# Patient Record
Sex: Female | Born: 1980 | Hispanic: Yes | Marital: Married | Smoking: Never smoker
Health system: Southern US, Community
[De-identification: ages and names within clinical notes are randomized; demographics above are authoritative.]

## PROBLEM LIST (undated history)

## (undated) DIAGNOSIS — Z8489 Family history of other specified conditions: Secondary | ICD-10-CM

## (undated) DIAGNOSIS — N200 Calculus of kidney: Secondary | ICD-10-CM

## (undated) DIAGNOSIS — O09299 Supervision of pregnancy with other poor reproductive or obstetric history, unspecified trimester: Secondary | ICD-10-CM

## (undated) DIAGNOSIS — I517 Cardiomegaly: Secondary | ICD-10-CM

## (undated) DIAGNOSIS — D649 Anemia, unspecified: Secondary | ICD-10-CM

## (undated) DIAGNOSIS — R0600 Dyspnea, unspecified: Secondary | ICD-10-CM

## (undated) DIAGNOSIS — I272 Pulmonary hypertension, unspecified: Secondary | ICD-10-CM

## (undated) HISTORY — DX: Calculus of kidney: N20.0

## (undated) HISTORY — DX: Pulmonary hypertension, unspecified: I27.20

## (undated) HISTORY — PX: HERNIA REPAIR: SHX51

## (undated) HISTORY — PX: NO PAST SURGERIES: SHX2092

## (undated) HISTORY — DX: Supervision of pregnancy with other poor reproductive or obstetric history, unspecified trimester: O09.299

## (undated) HISTORY — DX: Cardiomegaly: I51.7

## (undated) HISTORY — DX: Family history of other specified conditions: Z84.89

## (undated) HISTORY — DX: Dyspnea, unspecified: R06.00

## (undated) HISTORY — DX: Anemia, unspecified: D64.9

---

## 2001-02-15 ENCOUNTER — Emergency Department (HOSPITAL_COMMUNITY): Admission: EM | Admit: 2001-02-15 | Discharge: 2001-02-15 | Payer: Self-pay | Admitting: *Deleted

## 2009-02-26 DIAGNOSIS — O1413 Severe pre-eclampsia, third trimester: Secondary | ICD-10-CM

## 2010-08-29 ENCOUNTER — Inpatient Hospital Stay (HOSPITAL_COMMUNITY): Payer: Medicaid Other

## 2010-08-29 ENCOUNTER — Inpatient Hospital Stay (HOSPITAL_COMMUNITY)
Admission: AD | Admit: 2010-08-29 | Discharge: 2010-08-29 | Disposition: A | Discharge status: Home / Self Care | Payer: Medicaid Other | Source: Ambulatory Visit | Attending: Obstetrics & Gynecology | Admitting: Obstetrics & Gynecology

## 2010-08-29 DIAGNOSIS — O9989 Other specified diseases and conditions complicating pregnancy, childbirth and the puerperium: Secondary | ICD-10-CM

## 2010-08-29 DIAGNOSIS — O99891 Other specified diseases and conditions complicating pregnancy: Secondary | ICD-10-CM | POA: Insufficient documentation

## 2010-08-29 DIAGNOSIS — R109 Unspecified abdominal pain: Secondary | ICD-10-CM | POA: Insufficient documentation

## 2010-08-29 LAB — WET PREP, GENITAL
Trich, Wet Prep: NONE SEEN
Yeast Wet Prep HPF POC: NONE SEEN

## 2010-08-29 LAB — URINALYSIS, ROUTINE W REFLEX MICROSCOPIC
Nitrite: NEGATIVE
Specific Gravity, Urine: 1.025 (ref 1.005–1.030)
Urobilinogen, UA: 0.2 mg/dL (ref 0.0–1.0)

## 2010-08-30 LAB — GC/CHLAMYDIA PROBE AMP, GENITAL: Chlamydia, DNA Probe: NEGATIVE

## 2010-08-31 LAB — URINE CULTURE: Culture  Setup Time: 201202210151

## 2011-01-05 ENCOUNTER — Inpatient Hospital Stay (HOSPITAL_COMMUNITY)
Admission: AD | Admit: 2011-01-05 | Discharge: 2011-01-07 | DRG: 775 | Disposition: A | Discharge status: Home / Self Care | Payer: Medicaid Other | Source: Ambulatory Visit | Attending: Family Medicine | Admitting: Family Medicine

## 2011-01-05 LAB — CBC
MCH: 23.2 pg — ABNORMAL LOW (ref 26.0–34.0)
MCV: 74.7 fL — ABNORMAL LOW (ref 78.0–100.0)
Platelets: 156 10*3/uL (ref 150–400)
RDW: 16 % — ABNORMAL HIGH (ref 11.5–15.5)
WBC: 7.5 10*3/uL (ref 4.0–10.5)

## 2011-01-05 LAB — RPR: RPR Ser Ql: NONREACTIVE

## 2011-01-24 ENCOUNTER — Other Ambulatory Visit: Payer: Self-pay | Admitting: Internal Medicine

## 2011-01-24 ENCOUNTER — Ambulatory Visit (HOSPITAL_COMMUNITY)
Admission: RE | Admit: 2011-01-24 | Discharge: 2011-01-24 | Disposition: A | Discharge status: Home / Self Care | Payer: Self-pay | Source: Ambulatory Visit | Attending: Internal Medicine | Admitting: Internal Medicine

## 2011-01-24 DIAGNOSIS — I2699 Other pulmonary embolism without acute cor pulmonale: Secondary | ICD-10-CM

## 2011-01-24 DIAGNOSIS — R0602 Shortness of breath: Secondary | ICD-10-CM | POA: Insufficient documentation

## 2011-01-24 DIAGNOSIS — R079 Chest pain, unspecified: Secondary | ICD-10-CM | POA: Insufficient documentation

## 2011-01-24 MED ORDER — IOHEXOL 300 MG/ML  SOLN
100.0000 mL | Freq: Once | INTRAMUSCULAR | Status: AC | PRN
  Administered 2011-01-24: 100 mL via INTRAVENOUS

## 2011-02-08 ENCOUNTER — Inpatient Hospital Stay (HOSPITAL_COMMUNITY)
Admission: AD | Admit: 2011-02-08 | Payer: Medicaid Other | Source: Ambulatory Visit | Admitting: Obstetrics & Gynecology

## 2011-07-06 ENCOUNTER — Encounter: Payer: Self-pay | Admitting: Cardiology

## 2011-07-07 ENCOUNTER — Institutional Professional Consult (permissible substitution): Payer: Medicaid Other | Admitting: Cardiology

## 2011-07-18 ENCOUNTER — Encounter: Payer: Self-pay | Admitting: Cardiology

## 2013-04-30 ENCOUNTER — Ambulatory Visit: Payer: Medicaid Other

## 2013-05-14 ENCOUNTER — Ambulatory Visit: Payer: Medicaid Other | Attending: Internal Medicine

## 2013-07-01 ENCOUNTER — Other Ambulatory Visit (HOSPITAL_COMMUNITY)
Admission: RE | Admit: 2013-07-01 | Discharge: 2013-07-01 | Disposition: A | Discharge status: Home / Self Care | Payer: No Typology Code available for payment source | Source: Ambulatory Visit | Attending: Internal Medicine | Admitting: Internal Medicine

## 2013-07-01 ENCOUNTER — Encounter: Payer: Self-pay | Admitting: Internal Medicine

## 2013-07-01 ENCOUNTER — Ambulatory Visit: Payer: No Typology Code available for payment source | Attending: Internal Medicine | Admitting: Internal Medicine

## 2013-07-01 VITALS — BP 107/72 | HR 60 | Temp 97.9°F | Resp 16 | Ht 59.0 in | Wt 162.0 lb

## 2013-07-01 DIAGNOSIS — Z113 Encounter for screening for infections with a predominantly sexual mode of transmission: Secondary | ICD-10-CM | POA: Insufficient documentation

## 2013-07-01 DIAGNOSIS — K029 Dental caries, unspecified: Secondary | ICD-10-CM | POA: Insufficient documentation

## 2013-07-01 DIAGNOSIS — K429 Umbilical hernia without obstruction or gangrene: Secondary | ICD-10-CM | POA: Insufficient documentation

## 2013-07-01 DIAGNOSIS — Z1151 Encounter for screening for human papillomavirus (HPV): Secondary | ICD-10-CM | POA: Insufficient documentation

## 2013-07-01 DIAGNOSIS — Z01419 Encounter for gynecological examination (general) (routine) without abnormal findings: Secondary | ICD-10-CM | POA: Insufficient documentation

## 2013-07-01 DIAGNOSIS — N911 Secondary amenorrhea: Secondary | ICD-10-CM | POA: Insufficient documentation

## 2013-07-01 DIAGNOSIS — N760 Acute vaginitis: Secondary | ICD-10-CM | POA: Insufficient documentation

## 2013-07-01 DIAGNOSIS — Z0000 Encounter for general adult medical examination without abnormal findings: Secondary | ICD-10-CM | POA: Insufficient documentation

## 2013-07-01 DIAGNOSIS — N912 Amenorrhea, unspecified: Secondary | ICD-10-CM

## 2013-07-01 LAB — CBC WITH DIFFERENTIAL/PLATELET
Basophils Absolute: 0 10*3/uL (ref 0.0–0.1)
Eosinophils Absolute: 0.3 10*3/uL (ref 0.0–0.7)
Eosinophils Relative: 3 % (ref 0–5)
MCH: 26.8 pg (ref 26.0–34.0)
MCV: 79.6 fL (ref 78.0–100.0)
Platelets: 281 10*3/uL (ref 150–400)
RDW: 14.3 % (ref 11.5–15.5)
WBC: 8.6 10*3/uL (ref 4.0–10.5)

## 2013-07-01 LAB — LIPID PANEL
HDL: 50 mg/dL (ref >39)
LDL Cholesterol: 155 mg/dL — ABNORMAL HIGH (ref 0–99)

## 2013-07-01 LAB — CMP AND LIVER
ALT: 24 U/L (ref 0–35)
AST: 22 U/L (ref 0–37)
Alkaline Phosphatase: 73 U/L (ref 39–117)
Bilirubin, Direct: 0.1 mg/dL (ref 0.0–0.3)
Creat: 0.5 mg/dL (ref 0.50–1.10)
Total Bilirubin: 0.3 mg/dL (ref 0.3–1.2)

## 2013-07-01 LAB — TSH: TSH: 0.68 u[IU]/mL (ref 0.350–4.500)

## 2013-07-01 NOTE — Progress Notes (Signed)
Pt is here to establish care. Pt is requesting a physical and to check where she has a hernia.

## 2013-07-02 LAB — URINALYSIS, COMPLETE
Bacteria, UA: NONE SEEN
Bilirubin Urine: NEGATIVE
Casts: NONE SEEN
Crystals: NONE SEEN
Glucose, UA: NEGATIVE mg/dL
Ketones, ur: NEGATIVE mg/dL
Leukocytes, UA: NEGATIVE
Nitrite: NEGATIVE
Protein, ur: NEGATIVE mg/dL
Specific Gravity, Urine: 1.024 (ref 1.005–1.030)
Urobilinogen, UA: 0.2 mg/dL (ref 0.0–1.0)
pH: 5.5 (ref 5.0–8.0)

## 2013-07-02 NOTE — Progress Notes (Signed)
Patient ID: Kelly Oneal, female   DOB: 1980-07-29, 32 y.o.   MRN: [id number] Patient Demographics  Kelly Oneal, is a 32 y.o. female  NWG:[id number]  [id number]:[id number]  DOB - [birth year]  CC:  Chief Complaint  Patient presents with  . Establish Care       HPI: Kelly Oneal is a 32 y.o. female here today to establish medical care. Patient has no significant past medical history. She wants a general physical examination and a Pap smear done. Her last child birth was about 2 and half years ago, since then she has not seen her menstrual period. She told me she's not concerned, she breast-fed for a little while or one year, not present in in the morning. She denies any headache or blurry vision. No bloating or abdominal distention. During her last pregnancy she noticed that her umbilical hernia increased in size and she experiences abdominal discomfort occasionally as a result. She wants to be referred for repair. She does not smoke cigarette, she does not drink alcohol Patient has No headache, No chest pain, No abdominal pain - No Nausea, No new weakness tingling or numbness, No Cough - SOB.  Allergies  Allergen Reactions  . Penicillins    History reviewed. No pertinent past medical history. No current outpatient prescriptions on file prior to visit.   No current facility-administered medications on file prior to visit.   History reviewed. No pertinent family history. History   Social History  . Marital Status: Married    Spouse Name: N/A    Number of Children: N/A  . Years of Education: N/A   Occupational History  . Not on file.   Social History Main Topics  . Smoking status: Never Smoker   . Smokeless tobacco: Not on file  . Alcohol Use: No  . Drug Use: No  . Sexual Activity: Yes   Other Topics Concern  . Not on file   Social History Narrative  . No narrative on file    Review of Systems: Constitutional: Negative for fever, chills,  diaphoresis, activity change, appetite change and fatigue. HENT: Negative for ear pain, nosebleeds, congestion, facial swelling, rhinorrhea, neck pain, neck stiffness and ear discharge.  Eyes: Negative for pain, discharge, redness, itching and visual disturbance. Respiratory: Negative for cough, choking, chest tightness, shortness of breath, wheezing and stridor.  Cardiovascular: Negative for chest pain, palpitations and leg swelling. Gastrointestinal: Umbilicus hernia with the patient her discomfort, Negative for abdominal distention. Genitourinary: Negative for dysuria, urgency, frequency, hematuria, flank pain, decreased urine volume, difficulty urinating and dyspareunia.  Musculoskeletal: Negative for back pain, joint swelling, arthralgia and gait problem. Neurological: Negative for dizziness, tremors, seizures, syncope, facial asymmetry, speech difficulty, weakness, light-headedness, numbness and headaches.  Hematological: Negative for adenopathy. Does not bruise/bleed easily. Psychiatric/Behavioral: Negative for hallucinations, behavioral problems, confusion, dysphoric mood, decreased concentration and agitation.    Objective:   Filed Vitals:   07/01/13 1025  BP: 107/72  Pulse: 60  Temp: 97.9 F (36.6 C)  Resp: 16    Physical Exam: Constitutional: Patient appears well-developed and well-nourished. No distress, obese HENT: Normocephalic, atraumatic, External right and left ear normal. Oropharynx is clear and moist. Dental caries Eyes: Conjunctivae and EOM are normal. PERRLA, no scleral icterus. Neck: Normal ROM. Neck supple. No JVD. No tracheal deviation. No thyromegaly. CVS: RRR, S1/S2 +, no murmurs, no gallops, no carotid bruit.  Pulmonary: Effort and breath sounds normal, no stridor, rhonchi, wheezes, rales.  Abdominal: Soft. BS +, no distension, umbilicus hernia, reducible, nontender,  no tenderness, rebound or guarding.  Musculoskeletal: Normal range of motion. No edema and  no tenderness.  Lymphadenopathy: No lymphadenopathy noted, cervical, inguinal or axillary Neuro: Alert. Normal reflexes, muscle tone coordination. No cranial nerve deficit. Skin: Skin is warm and dry. No rash noted. Not diaphoretic. No erythema. No pallor. Psychiatric: Normal mood and affect. Behavior, judgment, thought content normal.  Lab Results  Component Value Date   WBC 8.6 07/01/2013   HGB 14.3 07/01/2013   HCT 42.5 07/01/2013   MCV 79.6 07/01/2013   PLT 281 07/01/2013   Lab Results  Component Value Date   CREATININE 0.50 07/01/2013   BUN 11 07/01/2013   NA 137 07/01/2013   K 3.5 07/01/2013   CL 101 07/01/2013   CO2 28 07/01/2013    No results found for this basename: HGBA1C   Lipid Panel     Component Value Date/Time   CHOL 225* 07/01/2013 1127   TRIG 99 07/01/2013 1127   HDL 50 07/01/2013 1127   CHOLHDL 4.5 07/01/2013 1127   VLDL 20 07/01/2013 1127   LDLCALC 155* 07/01/2013 1127       Assessment and plan:   1. Amenorrhea, secondary  - TSH - T4, free May need hormonal workup and possible gynecology referral, patient is not concerned at the moment and does not want to do these tests  2. Annual physical exam  - CBC with Differential - CMP and Liver - Urinalysis, Complete - Lipid panel - TSH - T4, free - Cytology - PAP - Cervicovaginal ancillary only  3. Umbilical hernia  - Ambulatory referral to General Surgery  4. Dental caries  - Ambulatory referral to Dentistry  Patient was extensively counseled on nutrition and exercise  Follow up in 3 months or when necessary   The patient was given clear instructions to go to ER or return to medical center if symptoms don't improve, worsen or new problems develop. The patient verbalized understanding. The patient was told to call to get lab results if they haven't heard anything in the next week.     Jeanann Lewandowsky, MD, MHA, FACP, FAAP [facility]  [place], [place] [phone]   07/02/2013, 2:13 PM

## 2013-09-29 ENCOUNTER — Ambulatory Visit: Payer: No Typology Code available for payment source

## 2016-07-12 ENCOUNTER — Encounter: Payer: Self-pay | Admitting: Internal Medicine

## 2016-08-30 ENCOUNTER — Encounter (HOSPITAL_BASED_OUTPATIENT_CLINIC_OR_DEPARTMENT_OTHER): Payer: Self-pay | Admitting: *Deleted

## 2016-08-30 ENCOUNTER — Emergency Department (HOSPITAL_BASED_OUTPATIENT_CLINIC_OR_DEPARTMENT_OTHER)
Admission: EM | Admit: 2016-08-30 | Discharge: 2016-08-31 | Disposition: A | Discharge status: Home / Self Care | Payer: Self-pay | Attending: Emergency Medicine | Admitting: Emergency Medicine

## 2016-08-30 DIAGNOSIS — R112 Nausea with vomiting, unspecified: Secondary | ICD-10-CM | POA: Insufficient documentation

## 2016-08-30 DIAGNOSIS — J029 Acute pharyngitis, unspecified: Secondary | ICD-10-CM | POA: Insufficient documentation

## 2016-08-30 NOTE — ED Triage Notes (Signed)
Pt c/o sore throat and vomiting x 3 days

## 2016-08-31 ENCOUNTER — Emergency Department (HOSPITAL_BASED_OUTPATIENT_CLINIC_OR_DEPARTMENT_OTHER): Payer: Self-pay

## 2016-08-31 LAB — RAPID STREP SCREEN (MED CTR MEBANE ONLY): STREPTOCOCCUS, GROUP A SCREEN (DIRECT): NEGATIVE

## 2016-08-31 LAB — PREGNANCY, URINE: Preg Test, Ur: NEGATIVE

## 2016-08-31 MED ORDER — SODIUM CHLORIDE 0.9 % IV BOLUS (SEPSIS)
1000.0000 mL | Freq: Once | INTRAVENOUS | Status: AC
  Administered 2016-08-31: 1000 mL via INTRAVENOUS

## 2016-08-31 MED ORDER — ONDANSETRON 8 MG PO TBDP: 8.0000 mg | ORAL_TABLET | Freq: Three times a day (TID) | ORAL | 0 refills | Status: DC | PRN

## 2016-08-31 MED ORDER — ONDANSETRON HCL 4 MG/2ML IJ SOLN
4.0000 mg | Freq: Once | INTRAMUSCULAR | Status: AC
  Administered 2016-08-31: 4 mg via INTRAVENOUS
  Filled 2016-08-31: qty 2

## 2016-08-31 MED ORDER — DEXAMETHASONE SODIUM PHOSPHATE 10 MG/ML IJ SOLN
10.0000 mg | Freq: Once | INTRAMUSCULAR | Status: AC
  Administered 2016-08-31: 10 mg via INTRAVENOUS
  Filled 2016-08-31: qty 1

## 2016-08-31 MED ORDER — LORAZEPAM 2 MG/ML IJ SOLN
1.0000 mg | Freq: Once | INTRAMUSCULAR | Status: AC
  Administered 2016-08-31: 1 mg via INTRAVENOUS
  Filled 2016-08-31: qty 1

## 2016-08-31 NOTE — ED Provider Notes (Signed)
[facility] Provider Note: Lowella Dell, MD, FACEP  CSN: [id number] MRN: [id number] ARRIVAL: 08/30/16 at 2021 ROOM: MH01/MH01   CHIEF COMPLAINT  Vomiting   HISTORY OF PRESENT ILLNESS  Kelly Oneal is a 36 y.o. female with a two-day history of nausea, vomiting and sore throat. She has not been able to keep anything on her stomach. She has moderate pain in her throat, worse with swallowing and is having difficulty swallowing her saliva. She is not having difficulty breathing. She denies abdominal pain, diarrhea or fever.   Past Medical History:  Diagnosis Date  . Anemia   . Cardiomegaly   . Dyspnea   . Kidney stones   . Pulmonary hypertension     Past Surgical History:  Procedure Laterality Date  . HERNIA REPAIR      No family history on file.  Social History  Substance Use Topics  . Smoking status: Never Smoker  . Smokeless tobacco: Not on file  . Alcohol use No    Prior to Admission medications   Medication Sig Start Date End Date Taking? Authorizing Provider  ondansetron (ZOFRAN ODT) 8 MG disintegrating tablet Take 1 tablet (8 mg total) by mouth every 8 (eight) hours as needed for nausea or vomiting. 08/31/16   Paula Libra, MD    Allergies Penicillins   REVIEW OF SYSTEMS  Negative except as noted here or in the History of Present Illness.   PHYSICAL EXAMINATION  Initial Vital Signs Blood pressure 120/95, pulse 89, temperature 98.6 F (37 C), temperature source Oral, resp. rate 18, height 4\' 11"  (1.499 m), weight 140 lb (63.5 kg), last menstrual period 08/16/2016, SpO2 99 %.  Examination General: Well-developed, well-nourished female in no acute distress; appearance consistent with age of record HENT: normocephalic; atraumatic; pharyngeal erythema without exudate; no dysphonia; uvula midline Eyes: pupils equal, round and reactive to light; extraocular muscles intact Neck: supple; anterior cervical lymphadenopathy Heart: regular rate  and rhythm Lungs: clear to auscultation bilaterally Abdomen: soft; nondistended; nontender; no masses or hepatosplenomegaly; bowel sounds present Extremities: No deformity; full range of motion; pulses normal Neurologic: Awake, alert and oriented; motor function intact in all extremities and symmetric; no facial droop Skin: Warm and dry Psychiatric: Normal mood and affect   RESULTS  Summary of this visit's results, reviewed by myself:   EKG Interpretation  Date/Time:    Ventricular Rate:    PR Interval:    QRS Duration:   QT Interval:    QTC Calculation:   R Axis:     Text Interpretation:        Laboratory Studies: Results for orders placed or performed during the hospital encounter of 08/30/16 (from the past 24 hour(s))  Rapid strep screen     Status: None   Collection Time: 08/31/16 12:20 AM  Result Value Ref Range   Streptococcus, Group A Screen (Direct) NEGATIVE NEGATIVE  Pregnancy, urine     Status: None   Collection Time: 08/31/16 12:55 AM  Result Value Ref Range   Preg Test, Ur NEGATIVE NEGATIVE   Imaging Studies: Dg Neck Soft Tissue  Result Date: 08/31/2016 CLINICAL DATA:  Sore throat and vomiting EXAM: NECK SOFT TISSUES - 1+ VIEW COMPARISON:  None. FINDINGS: Prevertebral soft tissue thickness is normal. Epiglottis normal. Subglottic trachea patent. Straightening of the cervical spine. IMPRESSION: Negative. Electronically Signed   By: Jasmine Pang M.D.   On: 08/31/2016 02:10    ED COURSE  Nursing notes and initial vitals signs, including pulse oximetry, reviewed.  Vitals:   08/30/16 2046 08/31/16 0016 08/31/16 0021 08/31/16 0310  BP: 120/95  117/92 99/68  Pulse: 89  98 88  Resp: 18  18 16   Temp: 98.6 F (37 C) 98.7 F (37.1 C)  98.3 F (36.8 C)  TempSrc: Oral Oral  Oral  SpO2: 99%  98% 98%  Weight: 140 lb (63.5 kg)     Height: 4\' 11"  (1.499 m)      3:13 AM Patient now feeling better after multiple medications. She is drinking fluids without emesis.  Her throat pain has improved.  PROCEDURES    ED DIAGNOSES     ICD-9-CM ICD-10-CM   1. Nausea and vomiting in adult 787.01 R11.2   2. Sore throat 462 J02.9        Paula LibraJohn Yarnell Kozloski, MD 08/31/16 [phone]

## 2016-08-31 NOTE — ED Notes (Signed)
Pt was able to tolerate ice chips and then started vomiting after trying to drink sprite

## 2016-08-31 NOTE — ED Notes (Signed)
Pt verbalizes understanding of d/c instructions and denies any further needs at this time. 

## 2016-09-02 LAB — CULTURE, GROUP A STREP (THRC)

## 2019-09-02 ENCOUNTER — Emergency Department (HOSPITAL_COMMUNITY): Payer: Self-pay

## 2019-09-02 ENCOUNTER — Other Ambulatory Visit: Payer: Self-pay

## 2019-09-02 ENCOUNTER — Encounter (HOSPITAL_COMMUNITY): Payer: Self-pay | Admitting: *Deleted

## 2019-09-02 ENCOUNTER — Emergency Department (HOSPITAL_COMMUNITY)
Admission: EM | Admit: 2019-09-02 | Discharge: 2019-09-02 | Disposition: A | Discharge status: Home / Self Care | Payer: Self-pay | Attending: Emergency Medicine | Admitting: Emergency Medicine

## 2019-09-02 DIAGNOSIS — W010XXA Fall on same level from slipping, tripping and stumbling without subsequent striking against object, initial encounter: Secondary | ICD-10-CM | POA: Insufficient documentation

## 2019-09-02 DIAGNOSIS — R52 Pain, unspecified: Secondary | ICD-10-CM

## 2019-09-02 DIAGNOSIS — Y939 Activity, unspecified: Secondary | ICD-10-CM | POA: Insufficient documentation

## 2019-09-02 DIAGNOSIS — R0789 Other chest pain: Secondary | ICD-10-CM | POA: Insufficient documentation

## 2019-09-02 DIAGNOSIS — Y999 Unspecified external cause status: Secondary | ICD-10-CM | POA: Insufficient documentation

## 2019-09-02 DIAGNOSIS — Y929 Unspecified place or not applicable: Secondary | ICD-10-CM | POA: Insufficient documentation

## 2019-09-02 MED ORDER — ACETAMINOPHEN 500 MG PO TABS
1000.0000 mg | ORAL_TABLET | Freq: Once | ORAL | Status: AC
  Administered 2019-09-02: 1000 mg via ORAL
  Filled 2019-09-02: qty 2

## 2019-09-02 MED ORDER — OXYCODONE HCL 5 MG PO TABS
5.0000 mg | ORAL_TABLET | Freq: Once | ORAL | Status: AC
Start: 2019-09-02 — End: 2019-09-02
  Administered 2019-09-02: 5 mg via ORAL
  Filled 2019-09-02: qty 1

## 2019-09-02 MED ORDER — KETOROLAC TROMETHAMINE 15 MG/ML IJ SOLN
15.0000 mg | Freq: Once | INTRAMUSCULAR | Status: AC
  Administered 2019-09-02: 12:00:00 15 mg via INTRAMUSCULAR
  Filled 2019-09-02: qty 1

## 2019-09-02 NOTE — ED Notes (Signed)
Provided patient an incentive spirometer and patient was able to demonstrate use after instructions.

## 2019-09-02 NOTE — Discharge Instructions (Signed)
You do not have an obvious fracture on your x-ray.  This does not mean that you do not have a broken rib.  Please take Tylenol and ibuprofen as needed for pain.  Try to splint this with a pillow as you need to take a deep breath.  Use the incentive spirometer 10 minutes out of every hour you are awake for the next week.  Please return for worsening shortness of breath pain or fever.

## 2019-09-02 NOTE — ED Provider Notes (Signed)
[facility]-EMERGENCY DEPT Provider Note   CSN: [id number] Arrival date & time: 09/02/19  1106     History Chief Complaint  Patient presents with  . Fall    Kelly Oneal is a 39 y.o. female.  39 yo F with a chief complaint of left chest wall pain.  Patient slipped this morning and landed on her left side.  Having some severe pain.  Start about 30 minutes ago.  She denies other injury.  Denies head injury denies loss consciousness denies neck pain denies back pain denies extremity pain.  Denies abdominal pain.  Some pain worse with breathing twisting or turning.  Described as sharp.  The history is provided by the patient.  Fall This is a new problem. The current episode started less than 1 hour ago. The problem occurs constantly. The problem has not changed since onset.Associated symptoms include chest pain. Pertinent negatives include no abdominal pain, no headaches and no shortness of breath. The symptoms are aggravated by bending, twisting and walking. Nothing relieves the symptoms. She has tried nothing for the symptoms. The treatment provided no relief.       Past Medical History:  Diagnosis Date  . Anemia   . Cardiomegaly   . Dyspnea   . Kidney stones   . Pulmonary hypertension [facility])     Patient Active Problem List   Diagnosis Date Noted  . Amenorrhea, secondary 07/01/2013  . Annual physical exam 07/01/2013  . Umbilical hernia 07/01/2013  . Dental caries 07/01/2013    Past Surgical History:  Procedure Laterality Date  . HERNIA REPAIR       OB History   No obstetric history on file.     No family history on file.  Social History   Tobacco Use  . Smoking status: Never Smoker  . Smokeless tobacco: Never Used  Substance Use Topics  . Alcohol use: No  . Drug use: No    Home Medications Prior to Admission medications   Medication Sig Start Date End Date Taking? Authorizing Provider  ondansetron (ZOFRAN ODT) 8 MG  disintegrating tablet Take 1 tablet (8 mg total) by mouth every 8 (eight) hours as needed for nausea or vomiting. 08/31/16   Molpus, John, MD    Allergies    Penicillins  Review of Systems   Review of Systems  Constitutional: Negative for chills and fever.  HENT: Negative for congestion and rhinorrhea.   Eyes: Negative for redness and visual disturbance.  Respiratory: Negative for shortness of breath and wheezing.   Cardiovascular: Positive for chest pain. Negative for palpitations.  Gastrointestinal: Negative for abdominal pain, nausea and vomiting.  Genitourinary: Negative for dysuria and urgency.  Musculoskeletal: Negative for arthralgias and myalgias.  Skin: Negative for pallor and wound.  Neurological: Negative for dizziness and headaches.    Physical Exam Updated Vital Signs BP 110/79 (BP Location: Left Arm)   Pulse 74   Temp 97.8 F (36.6 C) (Oral)   Resp 14   Ht 4\' 11"  (1.499 m)   Wt 68 kg   SpO2 97%   BMI 30.30 kg/m   Physical Exam Vitals and nursing note reviewed.  Constitutional:      General: She is not in acute distress.    Appearance: She is well-developed. She is not diaphoretic.  HENT:     Head: Normocephalic and atraumatic.  Eyes:     Pupils: Pupils are equal, round, and reactive to light.  Cardiovascular:     Rate and Rhythm: Normal  rate and regular rhythm.     Heart sounds: No murmur. No friction rub. No gallop.   Pulmonary:     Effort: Pulmonary effort is normal.     Breath sounds: No wheezing or rales.  Abdominal:     General: There is no distension.     Palpations: Abdomen is soft.     Tenderness: There is no abdominal tenderness.  Musculoskeletal:        General: Tenderness present.     Cervical back: Normal range of motion and neck supple.     Comments: Tenderness about the posterior rib angles about ribs 7 through 9 on the left.  No crepitus.  Clear lungs.  Palpated from head to toe without any other noted areas of bony tenderness.    Skin:    General: Skin is warm and dry.  Neurological:     Mental Status: She is alert and oriented to person, place, and time.  Psychiatric:        Behavior: Behavior normal.     ED Results / Procedures / Treatments   Labs (all labs ordered are listed, but only abnormal results are displayed) Labs Reviewed - No data to display  EKG None  Radiology DG Ribs Unilateral W/Chest Left  Result Date: 09/02/2019 CLINICAL DATA:  Fall, left lower rib pain EXAM: LEFT RIBS AND CHEST - 3+ VIEW COMPARISON:  None. FINDINGS: No fracture or other bone lesions are seen involving the ribs. There is no evidence of pneumothorax or pleural effusion. Both lungs are clear. Heart size and mediastinal contours are within normal limits. IMPRESSION: No displaced rib fracture or other radiographic abnormality to explain pain. Electronically Signed   By: Eddie Candle M.D.   On: 09/02/2019 11:41    Procedures Procedures (including critical care time)  Medications Ordered in ED Medications  acetaminophen (TYLENOL) tablet 1,000 mg (1,000 mg Oral Given 09/02/19 1148)  ketorolac (TORADOL) 15 MG/ML injection 15 mg (15 mg Intramuscular Given 09/02/19 1148)  oxyCODONE (Oxy IR/ROXICODONE) immediate release tablet 5 mg (5 mg Oral Given 09/02/19 1148)    ED Course  I have reviewed the triage vital signs and the nursing notes.  Pertinent labs & imaging results that were available during my care of the patient were reviewed by me and considered in my medical decision making (see chart for details).    MDM Rules/Calculators/A&P                      39 yo F with a chief complaint of a fall from standing and left-sided chest wall pain.  Plain film viewed by me without obvious fracture or pneumothorax.  Will treat with incentive spirometry Tylenol and ibuprofen.  Have her follow-up with her family doctor.  11:51 AM:  I have discussed the diagnosis/risks/treatment options with the patient and believe the pt to be  eligible for discharge home to follow-up with PCP. We also discussed returning to the ED immediately if new or worsening sx occur. We discussed the sx which are most concerning (e.g., sudden worsening pain, fever, inability to tolerate by mouth) that necessitate immediate return. Medications administered to the patient during their visit and any new prescriptions provided to the patient are listed below.  Medications given during this visit Medications  acetaminophen (TYLENOL) tablet 1,000 mg (1,000 mg Oral Given 09/02/19 1148)  ketorolac (TORADOL) 15 MG/ML injection 15 mg (15 mg Intramuscular Given 09/02/19 1148)  oxyCODONE (Oxy IR/ROXICODONE) immediate release tablet 5 mg (5 mg  Oral Given 09/02/19 1148)     The patient appears reasonably screen and/or stabilized for discharge and I doubt any other medical condition or other [facility] requiring further screening, evaluation, or treatment in the ED at this time prior to discharge.   Final Clinical Impression(s) / ED Diagnoses Final diagnoses:  Chest wall pain    Rx / DC Orders ED Discharge Orders    None       Melene Plan, DO 09/02/19 1151

## 2019-09-02 NOTE — ED Triage Notes (Signed)
Slipped and fell PTA hitting left ribs against box. No LOC or other body part involved.

## 2020-02-17 ENCOUNTER — Emergency Department (HOSPITAL_COMMUNITY): Payer: Self-pay

## 2020-02-17 ENCOUNTER — Other Ambulatory Visit: Payer: Self-pay

## 2020-02-17 ENCOUNTER — Emergency Department (HOSPITAL_COMMUNITY)
Admission: EM | Admit: 2020-02-17 | Discharge: 2020-02-17 | Disposition: A | Discharge status: Home / Self Care | Payer: Self-pay | Attending: Emergency Medicine | Admitting: Emergency Medicine

## 2020-02-17 ENCOUNTER — Encounter (HOSPITAL_COMMUNITY): Payer: Self-pay | Admitting: Student

## 2020-02-17 DIAGNOSIS — Y939 Activity, unspecified: Secondary | ICD-10-CM | POA: Insufficient documentation

## 2020-02-17 DIAGNOSIS — S93492A Sprain of other ligament of left ankle, initial encounter: Secondary | ICD-10-CM | POA: Insufficient documentation

## 2020-02-17 DIAGNOSIS — W541XXA Struck by dog, initial encounter: Secondary | ICD-10-CM | POA: Insufficient documentation

## 2020-02-17 DIAGNOSIS — Y929 Unspecified place or not applicable: Secondary | ICD-10-CM | POA: Insufficient documentation

## 2020-02-17 DIAGNOSIS — I272 Pulmonary hypertension, unspecified: Secondary | ICD-10-CM | POA: Insufficient documentation

## 2020-02-17 DIAGNOSIS — Y999 Unspecified external cause status: Secondary | ICD-10-CM | POA: Insufficient documentation

## 2020-02-17 NOTE — ED Triage Notes (Signed)
Patient BIB POV by her husband after a fall this morning.  Patient stated that she tripped over her dog and injured her left ankle.  Ankle is swollen and 7/10 pain.  Patient arrived to triage in a wheelchair and is A&OX4.

## 2020-02-17 NOTE — ED Provider Notes (Signed)
[facility]-EMERGENCY DEPT Provider Note   CSN: [id number] Arrival date & time: 02/17/20  0865     History Chief Complaint  Patient presents with  . Fall  . Ankle Injury    Kelly Oneal is a 39 y.o. female who presents emergency department chief complaint of left ankle pain.  Patient states that she injured her left ankle when she accidentally stepped on her dog.  She had an inversion injury.  She had immediate severe pain and has significant swelling to the left outer ankle.  She was unable to ambulate prior to arrival.  She denies any numbness or tingling.  HPI     Past Medical History:  Diagnosis Date  . Anemia   . Cardiomegaly   . Dyspnea   . Kidney stones   . Pulmonary hypertension [facility])     Patient Active Problem List   Diagnosis Date Noted  . Amenorrhea, secondary 07/01/2013  . Annual physical exam 07/01/2013  . Umbilical hernia 07/01/2013  . Dental caries 07/01/2013    Past Surgical History:  Procedure Laterality Date  . HERNIA REPAIR       OB History   No obstetric history on file.     History reviewed. No pertinent family history.  Social History   Tobacco Use  . Smoking status: Never Smoker  . Smokeless tobacco: Never Used  Substance Use Topics  . Alcohol use: No  . Drug use: No    Home Medications Prior to Admission medications   Medication Sig Start Date End Date Taking? Authorizing Provider  ondansetron (ZOFRAN ODT) 8 MG disintegrating tablet Take 1 tablet (8 mg total) by mouth every 8 (eight) hours as needed for nausea or vomiting. 08/31/16   Molpus, John, MD    Allergies    Penicillins  Review of Systems   Review of Systems Ten systems reviewed and are negative for acute change, except as noted in the HPI.   Physical Exam Updated Vital Signs BP 118/83   Pulse 76   Temp 98.6 F (37 C) (Oral)   Resp 16   Ht 4\' 11"  (1.499 m)   Wt 68 kg   SpO2 98%   BMI 30.30 kg/m   Physical  Exam Vitals and nursing note reviewed.  Constitutional:      General: She is not in acute distress.    Appearance: She is well-developed. She is not diaphoretic.  HENT:     Head: Normocephalic and atraumatic.  Eyes:     General: No scleral icterus.    Conjunctiva/sclera: Conjunctivae normal.  Cardiovascular:     Rate and Rhythm: Normal rate and regular rhythm.     Heart sounds: Normal heart sounds. No murmur heard.  No friction rub. No gallop.   Pulmonary:     Effort: Pulmonary effort is normal. No respiratory distress.     Breath sounds: Normal breath sounds.  Abdominal:     General: Bowel sounds are normal. There is no distension.     Palpations: Abdomen is soft. There is no mass.     Tenderness: There is no abdominal tenderness. There is no guarding.  Musculoskeletal:     Cervical back: Normal range of motion.     Comments: Patient appears well, vital signs are normal.  CV: RRR, No M/R/G, Peripheral pulses intact. No peripheral edema. Lungs: CTAB Abd: Soft, Non tender, non distended Musculoskeletal: There is swelling and tenderness over the lateral malleolus. No tenderness over the medial aspect of the ankle.  The fifth metatarsal is not tender. The ankle joint is intact without excessive opening on stressing. X-Ray shows fracture to be negative. The rest of the foot, ankle and leg exam is normal.   Skin:    General: Skin is warm and dry.  Neurological:     Mental Status: She is alert and oriented to person, place, and time.  Psychiatric:        Behavior: Behavior normal.     ED Results / Procedures / Treatments   Labs (all labs ordered are listed, but only abnormal results are displayed) Labs Reviewed - No data to display  EKG None  Radiology DG Ankle Complete Left  Result Date: 02/17/2020 CLINICAL DATA:  Larey Seat and injured left ankle is morning. EXAM: LEFT ANKLE COMPLETE - 3+ VIEW COMPARISON:  None. FINDINGS: The ankle mortise is maintained. No acute ankle fracture  is identified. The mid and hindfoot bony structures are intact. Extensive soft tissue swelling mainly laterally. IMPRESSION: No acute ankle fracture. Electronically Signed   By: Rudie Meyer M.D.   On: 02/17/2020 11:10    Procedures Procedures (including critical care time)  Medications Ordered in ED Medications - No data to display  ED Course  I have reviewed the triage vital signs and the nursing notes.  Pertinent labs & imaging results that were available during my care of the patient were reviewed by me and considered in my medical decision making (see chart for details).    MDM Rules/Calculators/A&P                          Patient with left ankle sprain.  I personally reviewed the patient's x-ray which shows no evidence of fracture dislocation.  Patient placed in ASO splint with crutches.  Discussed outpatient follow-up and return precautions.  Patient appears otherwise appropriate for discharge at this time. Final Clinical Impression(s) / ED Diagnoses Final diagnoses:  Sprain of anterior talofibular ligament of left ankle, initial encounter    Rx / DC Orders ED Discharge Orders    None       Arthor Captain, PA-C 02/17/20 1223    Geoffery Lyons, MD 02/18/20 [phone]

## 2020-02-17 NOTE — ED Notes (Signed)
Ortho tecg made aware patient needs crutches and an ASO for her left ankle.

## 2020-02-17 NOTE — Discharge Instructions (Signed)
Contact a health care provider if: °You have rapidly increasing bruising or swelling. °Your pain is not relieved with medicine. °Get help right away if: °Your foot or toes become numb or blue. °You have severe pain that gets worse. °

## 2021-10-25 ENCOUNTER — Ambulatory Visit
Admission: EM | Admit: 2021-10-25 | Discharge: 2021-10-25 | Disposition: A | Discharge status: Home / Self Care | Payer: 59 | Attending: Internal Medicine | Admitting: Internal Medicine

## 2021-10-25 ENCOUNTER — Encounter: Payer: Self-pay | Admitting: Emergency Medicine

## 2021-10-25 DIAGNOSIS — J029 Acute pharyngitis, unspecified: Secondary | ICD-10-CM | POA: Diagnosis present

## 2021-10-25 LAB — POCT RAPID STREP A (OFFICE): Rapid Strep A Screen: NEGATIVE

## 2021-10-25 NOTE — Discharge Instructions (Signed)
Rapid strep was negative.  Throat culture and COVID test are pending.  We will call if they are positive.  It appears that you have a viral illness that should run its course and self resolve in the next few days.  Please follow-up if symptoms persist or worsen. ?

## 2021-10-25 NOTE — ED Triage Notes (Signed)
Patient c/o fever x 1 day and sore throat, facial swelling.  No other sx's.  Denies taken any OTC pain meds. ?

## 2021-10-25 NOTE — ED Provider Notes (Signed)
?[facility] ? ? ? ?CSN: [id number] ?Arrival date & time: 10/25/21  1317 ? ? ?  ? ?History   ?Chief Complaint ?Chief Complaint  ?Patient presents with  ? Fever  ? ? ?HPI ?Kelly Oneal is a 41 y.o. female.  ? ?Patient presents with fever, sore throat, runny nose that has been present for 1 to 2 days.  She also reports that she has had some feelings of her bilateral cheeks of face swelling.  Denies pain to the area.  Her son has similar symptoms currently.  Patient has not taken any medications to help alleviate symptoms.  Denies any associated nasal congestion or cough.  Denies chest pain, shortness of breath, ear pain, nausea, vomiting, diarrhea, abdominal pain. ? ? ?Fever ? ?Past Medical History:  ?Diagnosis Date  ? Anemia   ? Cardiomegaly   ? Dyspnea   ? Kidney stones   ? Pulmonary hypertension ([place])   ? ? ?Patient Active Problem List  ? Diagnosis Date Noted  ? Amenorrhea, secondary 07/01/2013  ? Annual physical exam 07/01/2013  ? Umbilical hernia [id number]  ? Dental caries 07/01/2013  ? ? ?Past Surgical History:  ?Procedure Laterality Date  ? HERNIA REPAIR    ? ? ?OB History   ?No obstetric history on file. ?  ? ? ? ?Home Medications   ? ?Prior to Admission medications   ?Medication Sig Start Date End Date Taking? Authorizing Provider  ?ondansetron (ZOFRAN ODT) 8 MG disintegrating tablet Take 1 tablet (8 mg total) by mouth every 8 (eight) hours as needed for nausea or vomiting. 08/31/16   Molpus, Jenny Reichmann, MD  ? ? ?Family History ?History reviewed. No pertinent family history. ? ?Social History ?Social History  ? ?Tobacco Use  ? Smoking status: Never  ? Smokeless tobacco: Never  ?Substance Use Topics  ? Alcohol use: No  ? Drug use: No  ? ? ? ?Allergies   ?Penicillins ? ? ?Review of Systems ?Review of Systems ?Per HPI ? ?Physical Exam ?Triage Vital Signs ?ED Triage Vitals [10/25/21 1423]  ?Enc Vitals Group  ?   BP 125/83  ?   Pulse Rate 73  ?   Resp 18  ?   Temp 97.9 ?F (36.6 ?C)  ?   Temp  Source Oral  ?   SpO2 98 %  ?   Weight 150 lb (68 kg)  ?   Height 4\' 11"  (1.499 m)  ?   Head Circumference   ?   Peak Flow   ?   Pain Score 6  ?   Pain Loc   ?   Pain Edu?   ?   Excl. in [place]?   ? ?No data found. ? ?Updated Vital Signs ?BP 125/83 (BP Location: Left Arm)   Pulse 73   Temp 97.9 ?F (36.6 ?C) (Oral)   Resp 18   Ht 4\' 11"  (1.499 m)   Wt 150 lb (68 kg)   SpO2 98%   BMI 30.30 kg/m?  ? ?Visual Acuity ?Right Eye Distance:   ?Left Eye Distance:   ?Bilateral Distance:   ? ?Right Eye Near:   ?Left Eye Near:    ?Bilateral Near:    ? ?Physical Exam ?Constitutional:   ?   General: She is not in acute distress. ?   Appearance: Normal appearance. She is not toxic-appearing or diaphoretic.  ?HENT:  ?   Head: Normocephalic and atraumatic.  ?   Right Ear: Tympanic membrane and ear canal normal.  ?  Left Ear: Tympanic membrane and ear canal normal.  ?   Nose: Congestion present.  ?   Comments: No swelling to face.  ?   Mouth/Throat:  ?   Mouth: Mucous membranes are moist.  ?   Pharynx: Posterior oropharyngeal erythema present. No pharyngeal swelling, oropharyngeal exudate or uvula swelling.  ?   Tonsils: No tonsillar exudate or tonsillar abscesses.  ?Eyes:  ?   Extraocular Movements: Extraocular movements intact.  ?   Conjunctiva/sclera: Conjunctivae normal.  ?   Pupils: Pupils are equal, round, and reactive to light.  ?Cardiovascular:  ?   Rate and Rhythm: Normal rate and regular rhythm.  ?   Pulses: Normal pulses.  ?   Heart sounds: Normal heart sounds.  ?Pulmonary:  ?   Effort: Pulmonary effort is normal. No respiratory distress.  ?   Breath sounds: Normal breath sounds. No stridor. No wheezing, rhonchi or rales.  ?Abdominal:  ?   General: Abdomen is flat. Bowel sounds are normal.  ?   Palpations: Abdomen is soft.  ?Musculoskeletal:     ?   General: Normal range of motion.  ?   Cervical back: Normal range of motion.  ?Lymphadenopathy:  ?   Cervical: No cervical adenopathy.  ?Skin: ?   General: Skin is warm and  dry.  ?Neurological:  ?   General: No focal deficit present.  ?   Mental Status: She is alert and oriented to person, place, and time. Mental status is at baseline.  ?Psychiatric:     ?   Mood and Affect: Mood normal.     ?   Behavior: Behavior normal.  ? ? ? ?UC Treatments / Results  ?Labs ?(all labs ordered are listed, but only abnormal results are displayed) ?Labs Reviewed  ?CULTURE, GROUP A STREP [facility])  ?NOVEL CORONAVIRUS, NAA  ?POCT RAPID STREP A (OFFICE)  ? ? ?EKG ? ? ?Radiology ?No results found. ? ?Procedures ?Procedures (including critical care time) ? ?Medications Ordered in UC ?Medications - No data to display ? ?Initial Impression / Assessment and Plan / UC Course  ?I have reviewed the triage vital signs and the nursing notes. ? ?Pertinent labs & imaging results that were available during my care of the patient were reviewed by me and considered in my medical decision making (see chart for details). ? ?  ? ?Patient's symptoms appear viral in etiology.  Rapid strep was negative.  Throat culture is pending.  No signs of peritonsillar abscess.  Discussed supportive care and symptom management with patient.  No signs of facial swelling on exam.  Discussed return precautions.  Patient verbalized understanding and was agreeable with plan. ?Final Clinical Impressions(s) / UC Diagnoses  ? ?Final diagnoses:  ?Viral pharyngitis  ? ? ? ?Discharge Instructions   ? ?  ?Rapid strep was negative.  Throat culture and COVID test are pending.  We will call if they are positive.  It appears that you have a viral illness that should run its course and self resolve in the next few days.  Please follow-up if symptoms persist or worsen. ? ? ? ?ED Prescriptions   ?None ?  ? ?PDMP not reviewed this encounter. ?  ?Teodora Medici, [place] ?10/25/21 1513 ? ?

## 2021-10-26 LAB — NOVEL CORONAVIRUS, NAA: SARS-CoV-2, NAA: NOT DETECTED

## 2021-10-28 LAB — CULTURE, GROUP A STREP (THRC)

## 2021-11-01 ENCOUNTER — Other Ambulatory Visit (HOSPITAL_COMMUNITY)
Admission: RE | Admit: 2021-11-01 | Discharge: 2021-11-01 | Disposition: A | Discharge status: Home / Self Care | Payer: 59 | Source: Ambulatory Visit | Attending: Nurse Practitioner | Admitting: Nurse Practitioner

## 2021-11-01 DIAGNOSIS — Z124 Encounter for screening for malignant neoplasm of cervix: Secondary | ICD-10-CM | POA: Diagnosis present

## 2021-11-02 ENCOUNTER — Other Ambulatory Visit: Payer: Self-pay | Admitting: Nurse Practitioner

## 2021-11-02 DIAGNOSIS — Z1231 Encounter for screening mammogram for malignant neoplasm of breast: Secondary | ICD-10-CM

## 2021-11-04 LAB — CYTOLOGY - PAP
Comment: NEGATIVE
Diagnosis: NEGATIVE
High risk HPV: NEGATIVE

## 2021-11-05 NOTE — Progress Notes (Signed)
?Subjective:  ? ? Kelly Oneal - 41 y.o. female MRN [id number]  Date of birth: 19-Jul-1980 ? ?HPI ? ?Kelly Oneal is a 41 year-old female to establish care. ? ?Current issues and/or concerns: ?Reports left flank pain for 4 months. Makes difficult to sleep on left side at bedtime. Has noticed that left flank is larger than right flank. Denies additional red flag symptoms such as but not limited to chest pain, shortness of breath, nausea, vomiting, and blood in stool. ? ? ?ROS per HPI  ? ? ? ?Health Maintenance:  ?Health Maintenance Due  ?Topic Date Due  ? COVID-19 Vaccine (1) Never done  ? HIV Screening  Never done  ? Hepatitis C Screening  Never done  ? TETANUS/TDAP  Never done  ? ? ? ?Past Medical History: ?Patient Active Problem List  ? Diagnosis Date Noted  ? Amenorrhea, secondary 07/01/2013  ? Annual physical exam 07/01/2013  ? Umbilical hernia [id number]/[id number]  ? Dental caries 07/01/2013  ? ? ? ? ?Social History  ? reports that she has never smoked. She has never been exposed to tobacco smoke. She has never used smokeless tobacco. She reports that she does not drink alcohol and does not use drugs.  ? ?Family History  ?family history is not on file.  ? ?Medications: reviewed and updated ?  ?Objective:  ? Physical Exam ?BP 98/64 (BP Location: Left Arm, Patient Position: Sitting, Cuff Size: Large)   Pulse 62   Temp 98.3 ?F (36.8 ?C)   Resp 18   Ht 4' 10.66" (1.49 m)   Wt 152 lb (68.9 kg)   SpO2 97%   BMI 31.06 kg/m?  ? ?Physical Exam ?HENT:  ?   Head: Normocephalic and atraumatic.  ?Eyes:  ?   Extraocular Movements: Extraocular movements intact.  ?   Conjunctiva/sclera: Conjunctivae normal.  ?   Pupils: Pupils are equal, round, and reactive to light.  ?Cardiovascular:  ?   Rate and Rhythm: Normal rate and regular rhythm.  ?   Pulses: Normal pulses.  ?   Heart sounds: Normal heart sounds.  ?Pulmonary:  ?   Effort: Pulmonary effort is normal.  ?   Breath sounds: Normal breath sounds.   ?Abdominal:  ?   General: Bowel sounds are normal.  ?   Palpations: Abdomen is soft.  ?Musculoskeletal:  ?   Cervical back: Normal range of motion and neck supple.  ?Neurological:  ?   General: No focal deficit present.  ?   Mental Status: She is alert and oriented to person, place, and time.  ?Psychiatric:     ?   Mood and Affect: Mood normal.     ?   Behavior: Behavior normal.  ? ?Results for orders placed or performed in visit on 11/10/21  ?POCT URINALYSIS DIP (CLINITEK)  ?Result Value Ref Range  ? Color, UA yellow yellow  ? Clarity, UA clear clear  ? Glucose, UA negative negative mg/dL  ? Bilirubin, UA negative negative  ? Ketones, POC UA moderate (40) (A) negative mg/dL  ? Spec Grav, UA >=1.030 (A) 1.010 - 1.025  ? Blood, UA small (A) negative  ? pH, UA 5.5 5.0 - 8.0  ? POC PROTEIN,UA negative negative, trace  ? Urobilinogen, UA 0.2 0.2 or 1.0 E.U./dL  ? Nitrite, UA Negative Negative  ? Leukocytes, UA Negative Negative  ? ? ?Assessment & Plan:  ?1. Encounter to establish care: ?- Patient presents today to establish care.  ?- Return for annual physical  examination, labs, and health maintenance. Arrive fasting meaning having no food for at least 8 hours prior to appointment. You may have only water or black coffee. Please take scheduled medications as normal. ? ?2. Flank pain, chronic: ?- Ultrasound abdomen for further evaluation.  ?- CMP14+EGFR to check kidney function, liver function, and electrolyte balance.  ?- No urinary tract infection. Sending urine culture for further evaluation.  ?- US Abdomen Complete; Future ?- CMP14+EGFR ?- POCT URINALYSIS DIP (CLINITEK) ?- Urine Culture ? ? ? ?Patient was given clear instructions to go to Emergency Department or return to medical center if symptoms don't improve, worsen, or new problems develop.The patient verbalized understanding. ? ?I discussed the assessment and treatment plan with the patient. The patient was provided an opportunity to ask questions and all were  answered. The patient agreed with the plan and demonstrated an understanding of the instructions. ?  ?The patient was advised to call back or seek an in-person evaluation if the symptoms worsen or if the condition fails to improve as anticipated. ? ? ? ?Durene Fruits, NP ?11/10/2021, 10:23 AM ?Primary Care at [facility]  ? ?

## 2021-11-10 ENCOUNTER — Ambulatory Visit (INDEPENDENT_AMBULATORY_CARE_PROVIDER_SITE_OTHER): Payer: 59 | Admitting: Family

## 2021-11-10 ENCOUNTER — Encounter: Payer: Self-pay | Admitting: Family

## 2021-11-10 VITALS — BP 98/64 | HR 62 | Temp 98.3°F | Resp 18 | Ht 58.66 in | Wt 152.0 lb

## 2021-11-10 DIAGNOSIS — G8929 Other chronic pain: Secondary | ICD-10-CM

## 2021-11-10 DIAGNOSIS — Z7689 Persons encountering health services in other specified circumstances: Secondary | ICD-10-CM

## 2021-11-10 DIAGNOSIS — R109 Unspecified abdominal pain: Secondary | ICD-10-CM

## 2021-11-10 LAB — POCT URINALYSIS DIP (CLINITEK)
Bilirubin, UA: NEGATIVE
Glucose, UA: NEGATIVE mg/dL
Leukocytes, UA: NEGATIVE
Nitrite, UA: NEGATIVE
POC PROTEIN,UA: NEGATIVE
Spec Grav, UA: >=1.03 — AB (ref 1.010–1.025)
Urobilinogen, UA: 0.2 E.U./dL
pH, UA: 5.5 (ref 5.0–8.0)

## 2021-11-10 NOTE — Progress Notes (Signed)
Call patient with update.  ? ?No urinary tract infection.

## 2021-11-10 NOTE — Progress Notes (Signed)
Pt presents to establish care  ?Has complaints of left flank pain going on for about 4 months  ?

## 2021-11-11 LAB — CMP14+EGFR
ALT: 29 IU/L (ref 0–32)
AST: 21 IU/L (ref 0–40)
Albumin/Globulin Ratio: 1.9 (ref 1.2–2.2)
Albumin: 4.7 g/dL (ref 3.8–4.8)
Alkaline Phosphatase: 57 IU/L (ref 44–121)
BUN/Creatinine Ratio: 14 (ref 9–23)
BUN: 9 mg/dL (ref 6–24)
Bilirubin Total: 0.4 mg/dL (ref 0.0–1.2)
CO2: 23 mmol/L (ref 20–29)
Calcium: 9.7 mg/dL (ref 8.7–10.2)
Chloride: 104 mmol/L (ref 96–106)
Creatinine, Ser: 0.63 mg/dL (ref 0.57–1.00)
Globulin, Total: 2.5 g/dL (ref 1.5–4.5)
Glucose: 85 mg/dL (ref 70–99)
Potassium: 4.2 mmol/L (ref 3.5–5.2)
Sodium: 142 mmol/L (ref 134–144)
Total Protein: 7.2 g/dL (ref 6.0–8.5)
eGFR: 115 mL/min/{1.73_m2} (ref >59)

## 2021-11-11 NOTE — Progress Notes (Signed)
-   Kidney function and electrolytes normal.  ?- Liver function normal. ?- No urinary tract infection. Sending urine culture for further evaluation.

## 2021-11-13 LAB — URINE CULTURE

## 2021-11-13 NOTE — Progress Notes (Signed)
Urine culture normal.

## 2021-11-23 ENCOUNTER — Other Ambulatory Visit: Payer: 59

## 2021-11-25 ENCOUNTER — Other Ambulatory Visit: Payer: Self-pay | Admitting: Family

## 2021-11-25 ENCOUNTER — Ambulatory Visit (INDEPENDENT_AMBULATORY_CARE_PROVIDER_SITE_OTHER): Payer: 59

## 2021-11-25 DIAGNOSIS — K8020 Calculus of gallbladder without cholecystitis without obstruction: Secondary | ICD-10-CM

## 2021-11-25 DIAGNOSIS — G8929 Other chronic pain: Secondary | ICD-10-CM | POA: Diagnosis not present

## 2021-11-25 DIAGNOSIS — N281 Cyst of kidney, acquired: Secondary | ICD-10-CM

## 2021-11-25 DIAGNOSIS — R109 Unspecified abdominal pain: Secondary | ICD-10-CM

## 2021-11-25 NOTE — Progress Notes (Signed)
Referral to Gastroenterology for further evaluation/management of gall stone and renal cyst. Their office should call patient within 2 weeks with appointment details.

## 2021-11-25 NOTE — Progress Notes (Signed)
Referral to Urology for renal cyst.

## 2021-12-02 NOTE — Progress Notes (Unsigned)
Patient ID: Kelly Oneal, female    DOB: February 16, 1981  MRN: [id number]  CC: Annual Physical Exam  Subjective: Kelly Oneal is a 41 y.o. female who presents for annual physical exam.  Her concerns today include:  None.   Patient Active Problem List   Diagnosis Date Noted   Amenorrhea, secondary 07/01/2013   Annual physical exam 07/01/2013   Umbilical hernia 07/01/2013   Dental caries 07/01/2013     No current outpatient medications on file prior to visit.   No current facility-administered medications on file prior to visit.    Allergies  Allergen Reactions   Food Color Red Diarrhea   Penicillins Rash    Social History   Socioeconomic History   Marital status: Married    Spouse name: Not on file   Number of children: Not on file   Years of education: Not on file   Highest education level: Not on file  Occupational History   Not on file  Tobacco Use   Smoking status: Never    Passive exposure: Never   Smokeless tobacco: Never  Vaping Use   Vaping Use: Never used  Substance and Sexual Activity   Alcohol use: No   Drug use: No   Sexual activity: Yes    Birth control/protection: None  Other Topics Concern   Not on file  Social History Narrative   ** Merged History Encounter **       Social Determinants of Health   Financial Resource Strain: Not on file  Food Insecurity: Not on file  Transportation Needs: Not on file  Physical Activity: Not on file  Stress: Not on file  Social Connections: Not on file  Intimate Partner Violence: Not on file    No family history on file.  Past Surgical History:  Procedure Laterality Date   HERNIA REPAIR      ROS: Review of Systems Negative except as stated above  PHYSICAL EXAM: BP 93/62 (BP Location: Left Arm, Patient Position: Sitting, Cuff Size: Normal)   Pulse 60   Temp 98.3 F (36.8 C)   Resp 18   Ht 4' 10.66" (1.49 m)   Wt 150 lb (68 kg)   SpO2 98%   BMI 30.65 kg/m    Physical Exam HENT:     Head: Normocephalic and atraumatic.     Right Ear: Tympanic membrane, ear canal and external ear normal.     Left Ear: Tympanic membrane, ear canal and external ear normal.     Nose: Nose normal.     Mouth/Throat:     Mouth: Mucous membranes are moist.     Pharynx: Oropharynx is clear.  Eyes:     Extraocular Movements: Extraocular movements intact.     Conjunctiva/sclera: Conjunctivae normal.     Pupils: Pupils are equal, round, and reactive to light.  Cardiovascular:     Rate and Rhythm: Normal rate and regular rhythm.     Pulses: Normal pulses.     Heart sounds: Normal heart sounds.  Pulmonary:     Effort: Pulmonary effort is normal.     Breath sounds: Normal breath sounds.  Chest:     Comments: Patient declined.  Abdominal:     General: Bowel sounds are normal.     Palpations: Abdomen is soft.  Genitourinary:    Comments: Patient declined.  Musculoskeletal:        General: Normal range of motion.     Cervical back: Normal range of motion and neck supple.  Right knee: Normal.     Left knee: Normal.     Right lower leg: Normal.     Left lower leg: Normal.     Right ankle: Normal.     Left ankle: Normal.     Right foot: Normal.     Left foot: Normal.  Skin:    General: Skin is warm and dry.     Capillary Refill: Capillary refill takes less than 2 seconds.  Neurological:     General: No focal deficit present.     Mental Status: She is alert and oriented to person, place, and time.  Psychiatric:        Mood and Affect: Mood normal.        Behavior: Behavior normal.    ASSESSMENT AND PLAN: 1. Annual physical exam: - Counseled on 150 minutes of exercise per week as tolerated, healthy eating (including decreased daily intake of saturated fats, cholesterol, added sugars, sodium), STI prevention, and routine healthcare maintenance.  2. Screening for deficiency anemia: - CBC to screen for anemia. - CBC  3. Diabetes mellitus screening: -  Hemoglobin A1c to screen for pre-diabetes/diabetes. - Hemoglobin A1c  4. Screening cholesterol level: - Lipid panel to screen for high cholesterol.  - Lipid panel  5. Thyroid disorder screen: - TSH to check thyroid function.  - TSH  6. Need for hepatitis C screening test: - Hepatitis C antibody to screen for hepatitis C.  - Hepatitis C Antibody  7. Encounter for screening for HIV: - HIV antibody to screen for human immunodeficiency virus.  - HIV antibody (with reflex)  8. Encounter for screening mammogram for malignant neoplasm of breast: - Patient reports already scheduled per Gynecology.  9. Need for Tdap vaccination: - Administered today in office.  - Tdap vaccine greater than or equal to 7yo IM    Patient was given the opportunity to ask questions.  Patient verbalized understanding of the plan and was able to repeat key elements of the plan. Patient was given clear instructions to go to Emergency Department or return to medical center if symptoms don't improve, worsen, or new problems develop.The patient verbalized understanding.   Orders Placed This Encounter  Procedures   Tdap vaccine greater than or equal to 7yo IM   HIV antibody (with reflex)   Hepatitis C Antibody   CBC   Lipid panel   TSH   Hemoglobin A1c     Return in about 1 year (around 12/09/2022) for Physical per patient preference.  Rema Fendt, NP

## 2021-12-07 ENCOUNTER — Ambulatory Visit (INDEPENDENT_AMBULATORY_CARE_PROVIDER_SITE_OTHER): Payer: 59 | Admitting: Urology

## 2021-12-07 ENCOUNTER — Encounter: Payer: Self-pay | Admitting: Urology

## 2021-12-07 VITALS — BP 100/66 | HR 63 | Ht 58.66 in | Wt 145.0 lb

## 2021-12-07 DIAGNOSIS — N281 Cyst of kidney, acquired: Secondary | ICD-10-CM | POA: Diagnosis not present

## 2021-12-07 DIAGNOSIS — R1012 Left upper quadrant pain: Secondary | ICD-10-CM | POA: Diagnosis not present

## 2021-12-07 DIAGNOSIS — G8929 Other chronic pain: Secondary | ICD-10-CM

## 2021-12-07 LAB — MICROSCOPIC EXAMINATION

## 2021-12-07 LAB — URINALYSIS, COMPLETE
Bilirubin, UA: NEGATIVE
Glucose, UA: NEGATIVE
Ketones, UA: NEGATIVE
Nitrite, UA: NEGATIVE
Protein,UA: NEGATIVE
RBC, UA: NEGATIVE
Specific Gravity, UA: 1.025 (ref 1.005–1.030)
Urobilinogen, Ur: 0.2 mg/dL (ref 0.2–1.0)
pH, UA: 5.5 (ref 5.0–7.5)

## 2021-12-07 NOTE — Progress Notes (Signed)
   12/07/2021 11:01 AM   Ney Oct 03, 1980 [id number]  Referring provider: Camillia Herter, NP [address] [place],  [place] [address]  Chief Complaint  Patient presents with   New Patient (Initial Visit)    HPI: Kelly Oneal is a 41 y.o. female referred for a renal cyst evaluation.  Saw PCP 11/10/2021 with complaints of chronic left flank pain Dipstick UA with small blood.  Microscopy not performed Abdominal ultrasound was ordered which showed cholelithiasis and a 9 mm left renal cyst.  PMH: Past Medical History:  Diagnosis Date   Anemia    Cardiomegaly    Dyspnea    Kidney stones    Pulmonary hypertension ([place])     Surgical History: Past Surgical History:  Procedure Laterality Date   HERNIA REPAIR      Home Medications:  Allergies as of 12/07/2021       Reactions   Food Color Red Diarrhea   Penicillins         Medication List        Accurate as of Dec 07, 2021 11:01 AM. If you have any questions, ask your nurse or doctor.          medroxyPROGESTERone 10 MG tablet Commonly known as: PROVERA PLEASE SEE ATTACHED FOR DETAILED DIRECTIONS   ondansetron 8 MG disintegrating tablet Commonly known as: Zofran ODT Take 1 tablet (8 mg total) by mouth every 8 (eight) hours as needed for nausea or vomiting.        Allergies:  Allergies  Allergen Reactions   Food Color Red Diarrhea   Penicillins     Family History: History reviewed. No pertinent family history.  Social History:  reports that she has never smoked. She has never been exposed to tobacco smoke. She has never used smokeless tobacco. She reports that she does not drink alcohol and does not use drugs.   Physical Exam: BP 100/66   Pulse 63   Ht 4' 10.66" (1.49 m)   Wt 145 lb (65.8 kg)   BMI 29.63 kg/m   Constitutional:  Alert, No acute distress. HEENT: [place] AT Respiratory: Normal respiratory effort, no increased work of  breathing. Neurologic: Grossly intact, no focal deficits, moving all 4 extremities. Psychiatric: Normal mood and affect.  Laboratory Data:  Urinalysis Dipstick/microscopy negative   Pertinent Imaging: RUS images were personally reviewed and interpreted  Assessment & Plan:    1.  Left renal cyst Small simple renal cyst We discussed that this small cyst would not be a cause of flank pain  2.  Chronic LUQ pain Pain radiates laterally Schedule noncontrast CT abdomen pelvis Will call with results Follow-up with PCP if no urologic abnormalities noted on [place], MD  [facility] [address] [place], [place] [address] [phone]

## 2021-12-08 ENCOUNTER — Ambulatory Visit (INDEPENDENT_AMBULATORY_CARE_PROVIDER_SITE_OTHER): Payer: 59 | Admitting: Family

## 2021-12-08 ENCOUNTER — Encounter: Payer: Self-pay | Admitting: Family

## 2021-12-08 VITALS — BP 93/62 | HR 60 | Temp 98.3°F | Resp 18 | Ht 58.66 in | Wt 150.0 lb

## 2021-12-08 DIAGNOSIS — Z131 Encounter for screening for diabetes mellitus: Secondary | ICD-10-CM | POA: Diagnosis not present

## 2021-12-08 DIAGNOSIS — Z1329 Encounter for screening for other suspected endocrine disorder: Secondary | ICD-10-CM

## 2021-12-08 DIAGNOSIS — Z114 Encounter for screening for human immunodeficiency virus [HIV]: Secondary | ICD-10-CM

## 2021-12-08 DIAGNOSIS — Z1159 Encounter for screening for other viral diseases: Secondary | ICD-10-CM

## 2021-12-08 DIAGNOSIS — Z23 Encounter for immunization: Secondary | ICD-10-CM

## 2021-12-08 DIAGNOSIS — Z1231 Encounter for screening mammogram for malignant neoplasm of breast: Secondary | ICD-10-CM

## 2021-12-08 DIAGNOSIS — Z130 Encounter for screening for diseases of the blood and blood-forming organs and certain disorders involving the immune mechanism: Secondary | ICD-10-CM | POA: Diagnosis not present

## 2021-12-08 DIAGNOSIS — Z0000 Encounter for general adult medical examination without abnormal findings: Secondary | ICD-10-CM

## 2021-12-08 DIAGNOSIS — Z13220 Encounter for screening for lipoid disorders: Secondary | ICD-10-CM

## 2021-12-08 NOTE — Patient Instructions (Signed)
Preventive Care 41-41 Years Old, Female Preventive care refers to lifestyle choices and visits with your health care provider that can promote health and wellness. Preventive care visits are also called wellness exams. What can I expect for my preventive care visit? Counseling Your health care provider may ask you questions about your: Medical history, including: Past medical problems. Family medical history. Pregnancy history. Current health, including: Menstrual cycle. Method of birth control. Emotional well-being. Home life and relationship well-being. Sexual activity and sexual health. Lifestyle, including: Alcohol, nicotine or tobacco, and drug use. Access to firearms. Diet, exercise, and sleep habits. Work and work environment. Sunscreen use. Safety issues such as seatbelt and bike helmet use. Physical exam Your health care provider will check your: Height and weight. These may be used to calculate your BMI (body mass index). BMI is a measurement that tells if you are at a healthy weight. Waist circumference. This measures the distance around your waistline. This measurement also tells if you are at a healthy weight and may help predict your risk of certain diseases, such as type 2 diabetes and high blood pressure. Heart rate and blood pressure. Body temperature. Skin for abnormal spots. What immunizations do I need?  Vaccines are usually given at various ages, according to a schedule. Your health care provider will recommend vaccines for you based on your age, medical history, and lifestyle or other factors, such as travel or where you work. What tests do I need? Screening Your health care provider may recommend screening tests for certain conditions. This may include: Lipid and cholesterol levels. Diabetes screening. This is done by checking your blood sugar (glucose) after you have not eaten for a while (fasting). Pelvic exam and Pap test. Hepatitis B test. Hepatitis C  test. HIV (human immunodeficiency virus) test. STI (sexually transmitted infection) testing, if you are at risk. Lung cancer screening. Colorectal cancer screening. Mammogram. Talk with your health care provider about when you should start having regular mammograms. This may depend on whether you have a family history of breast cancer. BRCA-related cancer screening. This may be done if you have a family history of breast, ovarian, tubal, or peritoneal cancers. Bone density scan. This is done to screen for osteoporosis. Talk with your health care provider about your test results, treatment options, and if necessary, the need for more tests. Follow these instructions at home: Eating and drinking  Eat a diet that includes fresh fruits and vegetables, whole grains, lean protein, and low-fat dairy products. Take vitamin and mineral supplements as recommended by your health care provider. Do not drink alcohol if: Your health care provider tells you not to drink. You are pregnant, may be pregnant, or are planning to become pregnant. If you drink alcohol: Limit how much you have to 0-1 drink a day. Know how much alcohol is in your drink. In the [place] one drink equals one 12 oz bottle of beer (355 mL), one 5 oz glass of wine (148 mL), or one 1 oz glass of hard liquor (44 mL). Lifestyle Brush your teeth every morning and night with fluoride toothpaste. Floss one time each day. Exercise for at least 30 minutes 5 or more days each week. Do not use any products that contain nicotine or tobacco. These products include cigarettes, chewing tobacco, and vaping devices, such as e-cigarettes. If you need help quitting, ask your health care provider. Do not use drugs. If you are sexually active, practice safe sex. Use a condom or other form of protection to   prevent STIs. If you do not wish to become pregnant, use a form of birth control. If you plan to become pregnant, see your health care provider for a  prepregnancy visit. Take aspirin only as told by your health care provider. Make sure that you understand how much to take and what form to take. Work with your health care provider to find out whether it is safe and beneficial for you to take aspirin daily. Find healthy ways to manage stress, such as: Meditation, yoga, or listening to music. Journaling. Talking to a trusted person. Spending time with friends and family. Minimize exposure to UV radiation to reduce your risk of skin cancer. Safety Always wear your seat belt while driving or riding in a vehicle. Do not drive: If you have been drinking alcohol. Do not ride with someone who has been drinking. When you are tired or distracted. While texting. If you have been using any mind-altering substances or drugs. Wear a helmet and other protective equipment during sports activities. If you have firearms in your house, make sure you follow all gun safety procedures. Seek help if you have been physically or sexually abused. What's next? Visit your health care provider once a year for an annual wellness visit. Ask your health care provider how often you should have your eyes and teeth checked. Stay up to date on all vaccines. This information is not intended to replace advice given to you by your health care provider. Make sure you discuss any questions you have with your health care provider. Document Revised: 12/22/2020 Document Reviewed: 12/22/2020 Elsevier Patient Education  [place].

## 2021-12-08 NOTE — Progress Notes (Signed)
.  Pt presents for annual physical exam  

## 2021-12-09 LAB — CBC
Hematocrit: 44.4 % (ref 34.0–46.6)
Hemoglobin: 14.1 g/dL (ref 11.1–15.9)
MCH: 26.7 pg (ref 26.6–33.0)
MCHC: 31.8 g/dL (ref 31.5–35.7)
MCV: 84 fL (ref 79–97)
Platelets: 320 10*3/uL (ref 150–450)
RBC: 5.28 x10E6/uL (ref 3.77–5.28)
RDW: 13.9 % (ref 11.7–15.4)
WBC: 8.3 10*3/uL (ref 3.4–10.8)

## 2021-12-09 LAB — LIPID PANEL
Chol/HDL Ratio: 5 ratio — ABNORMAL HIGH (ref 0.0–4.4)
Cholesterol, Total: 207 mg/dL — ABNORMAL HIGH (ref 100–199)
HDL: 41 mg/dL (ref >39)
LDL Chol Calc (NIH): 153 mg/dL — ABNORMAL HIGH (ref 0–99)
Triglycerides: 71 mg/dL (ref 0–149)
VLDL Cholesterol Cal: 13 mg/dL (ref 5–40)

## 2021-12-09 LAB — TSH: TSH: 1.02 u[IU]/mL (ref 0.450–4.500)

## 2021-12-09 LAB — HEMOGLOBIN A1C
Est. average glucose Bld gHb Est-mCnc: 100 mg/dL
Hgb A1c MFr Bld: 5.1 % (ref 4.8–5.6)

## 2021-12-09 LAB — HIV ANTIBODY (ROUTINE TESTING W REFLEX): HIV Screen 4th Generation wRfx: NONREACTIVE

## 2021-12-09 LAB — HEPATITIS C ANTIBODY: Hep C Virus Ab: NONREACTIVE

## 2021-12-09 NOTE — Progress Notes (Signed)
-   Thyroid function normal. - No diabetes.  - No anemia.  - Hepatitis C negative.  - HIV negative.   The following abnormalities are noted:   Cholesterol higher than expected. High cholesterol may increase risk of heart attack and/or stroke. Consider eating more fruits, vegetables, and lean baked meats such as chicken or fish. Moderate intensity exercise at least 150 minutes as tolerated per week may help as well.  However, your risk of heart attack/stroke in ten years is low so does not need to start a medication at the moment.  All other values are normal, stable or within acceptable limits.  Medication changes / Follow up labs / Other changes or recommendations:   - Recheck fasting cholesterol in 3 to 6 months.  The following is for provider reference only: The 10-year ASCVD risk score (Arnett DK, et al., 2019) is: 0.6%   Values used to calculate the score:     Age: 41 years     Sex: Female     Is Non-Hispanic African American: No     Diabetic: No     Tobacco smoker: No     Systolic Blood Pressure: 93 mmHg     Is BP treated: No     HDL Cholesterol: 41 mg/dL     Total Cholesterol: 207 mg/dL  Rema Fendt, NP 09/14/1694 1:11 PM

## 2021-12-19 ENCOUNTER — Ambulatory Visit: Admission: RE | Admit: 2021-12-19 | Payer: 59 | Source: Ambulatory Visit

## 2021-12-22 ENCOUNTER — Ambulatory Visit: Payer: 59

## 2021-12-29 ENCOUNTER — Ambulatory Visit: Payer: 59

## 2022-01-12 ENCOUNTER — Ambulatory Visit (INDEPENDENT_AMBULATORY_CARE_PROVIDER_SITE_OTHER): Payer: 59

## 2022-01-12 DIAGNOSIS — Z1231 Encounter for screening mammogram for malignant neoplasm of breast: Secondary | ICD-10-CM

## 2022-01-13 ENCOUNTER — Other Ambulatory Visit: Payer: Self-pay | Admitting: Nurse Practitioner

## 2022-01-13 DIAGNOSIS — R928 Other abnormal and inconclusive findings on diagnostic imaging of breast: Secondary | ICD-10-CM

## 2022-07-10 NOTE — L&D Delivery Note (Signed)
Delivery Note Kelly Oneal is a 42 y.o. G3P2002 at [redacted]w[redacted]d admitted for IOL for AMA.   GBS Status:  Negative/-- (11/26 4034)  Labor course: Initial SVE: 1.5-50/-3. Augmentation with: Pitocin and Cytotec. She then progressed to complete.  ROM: 14h 29m with clear fluid  Birth: After a 10 minute 2nd stage, she delivered a Live born female  Birth Weight:   APGAR: 8, 9  Newborn Delivery   Birth date/time: 06/19/2023 04:13:00 Delivery type: Vaginal, Spontaneous        Delivered via spontaneous vaginal delivery (Presentation: LOA ). Nuchal cord present: No. . Shoulders and body delivered in usual fashion. Infant placed directly on mom's abdomen for bonding/skin-to-skin, baby dried and stimulated. Cord clamped x 2 after 1 minute and cut by FOB.  Cord blood collected. Placenta delivered-Spontaneous with 3 vessels. 20u Pitocin in 500cc LR given as a bolus prior to delivery of placenta.  Fundus firm with massage. Placenta inspected and appears to be intact with a 3 VC.  Sponge and instrument count were correct x2.  Intrapartum complications:  None Anesthesia:  epidural Lacerations:  none Suture Repair:  EBL (mL):14.00    Mom to postpartum.  Baby to Couplet care / Skin to Skin. Placenta to L&D   Plans to Breast and bottlefeed Contraception: tubal ligation Circumcision: N/A  Note sent to [facility]: [facility] for pp visit.  Delivery Report:  Review the Delivery Report for details.     Signed: Jacklyn Shell, DNP,CNM 06/19/2023, 4:31 AM

## 2022-11-16 ENCOUNTER — Ambulatory Visit
Admission: EM | Admit: 2022-11-16 | Discharge: 2022-11-16 | Disposition: A | Discharge status: Home / Self Care | Payer: Self-pay | Attending: Family Medicine | Admitting: Family Medicine

## 2022-11-16 DIAGNOSIS — Z3201 Encounter for pregnancy test, result positive: Secondary | ICD-10-CM

## 2022-11-16 DIAGNOSIS — Z3490 Encounter for supervision of normal pregnancy, unspecified, unspecified trimester: Secondary | ICD-10-CM

## 2022-11-16 LAB — POCT URINE PREGNANCY: Preg Test, Ur: POSITIVE — AB

## 2022-11-16 MED ORDER — PRENATAL ADULT GUMMY/DHA/FA 0.4-25 MG PO CHEW: 1.0000 | CHEWABLE_TABLET | Freq: Every day | ORAL | 0 refills | Status: AC

## 2022-11-16 NOTE — Discharge Instructions (Signed)
Your blood work should result within the next 1 to 2 days.  Someone from our office will contact you via MyChart or via phone to give you the results.  Blood work will give [place] a better idea of how many weeks pregnant you are.  In the meantime start a daily prenatal vitamin.  If you experience any severe cramping or vaginal bleeding go to the [facility] for immediate evaluation.

## 2022-11-16 NOTE — ED Triage Notes (Signed)
Pt states she would like a pregnancy test states she has a positive test at home.

## 2022-11-16 NOTE — ED Provider Notes (Signed)
[facility]    CSN: [id number] Arrival date & time: 11/16/22  1549      History   Chief Complaint Chief Complaint  Patient presents with   Possible Pregnancy    HPI Kelly Oneal is a 42 y.o. female.   HPI Patient in today for pregnancy test.  Patient reports that her last known period was in February however she was not initially concerned about pregnancy as she has irregular menstrual periods.  She reports becoming concerned of pregnancy as she has been increasingly fatigued and gained a few pounds without trying. Patient has no other concerns. Past Medical History:  Diagnosis Date   Anemia    Cardiomegaly    Dyspnea    Kidney stones    Pulmonary hypertension [facility])     Patient Active Problem List   Diagnosis Date Noted   Amenorrhea, secondary 07/01/2013   Annual physical exam 07/01/2013   Umbilical hernia 07/01/2013   Dental caries 07/01/2013    Past Surgical History:  Procedure Laterality Date   HERNIA REPAIR      OB History   No obstetric history on file.      Home Medications    Prior to Admission medications   Medication Sig Start Date End Date Taking? Authorizing Provider  Prenatal MV & Min w/FA-DHA (PRENATAL ADULT GUMMY/DHA/FA) 0.4-25 MG CHEW Chew 1 tablet by mouth daily. 11/16/22  Yes Bing Neighbors, NP    Family History History reviewed. No pertinent family history.  Social History Social History   Tobacco Use   Smoking status: Never    Passive exposure: Never   Smokeless tobacco: Never  Vaping Use   Vaping Use: Never used  Substance Use Topics   Alcohol use: No   Drug use: No     Allergies   Food color red and Penicillins   Review of Systems Review of Systems Pertinent negatives listed in HPI   Physical Exam Triage Vital Signs ED Triage Vitals  Enc Vitals Group     BP 11/16/22 1604 117/74     Pulse Rate 11/16/22 1604 70     Resp 11/16/22 1604 16     Temp 11/16/22 1604 98.1 F (36.7 C)      Temp Source 11/16/22 1604 Oral     SpO2 11/16/22 1604 97 %     Weight --      Height --      Head Circumference --      Peak Flow --      Pain Score 11/16/22 1605 0     Pain Loc --      Pain Edu? --      Excl. in GC? --    No data found.  Updated Vital Signs BP 117/74 (BP Location: Left Arm)   Pulse 70   Temp 98.1 F (36.7 C) (Oral)   Resp 16   LMP  (LMP Unknown) Comment: states she thinks it was in February  of 2024  SpO2 97%   Visual Acuity Right Eye Distance:   Left Eye Distance:   Bilateral Distance:    Right Eye Near:   Left Eye Near:    Bilateral Near:     Physical Exam General appearance: Alert, well developed, well nourished, cooperative Head: Normocephalic, without obvious abnormality, atraumatic Heart: Rate and Rhythm normal. No gallop or murmurs noted on exam  Respiratory: Respirations even and unlabored, normal respiratory rate Extremities: No gross deformities Skin: Skin color, texture, turgor normal. No rashes seen  Psych: Appropriate mood and affect. UC Treatments / Results  Labs (all labs ordered are listed, but only abnormal results are displayed) Labs Reviewed  POCT URINE PREGNANCY - Abnormal; Notable for the following components:      Result Value   Preg Test, Ur Positive (*)    All other components within normal limits  HCG, QUANTITATIVE, PREGNANCY    EKG   Radiology No results found.  Procedures Procedures (including critical care time)  Medications Ordered in UC Medications - No data to display  Initial Impression / Assessment and Plan / UC Course  I have reviewed the triage vital signs and the nursing notes.  Pertinent labs & imaging results that were available during my care of the patient were reviewed by me and considered in my medical decision making (see chart for details).    Urine hCG is positive.  hCG quantitative ordered as patient is unable to last menstrual cycle positive pregnancy test.  Advised to start prenatal  vitamins.  Information given to readily established with OB/GYN as patient will be considered advanced age pregnancy which can increase risk associated with pregnancy.  Advised that she will establish with OB/GYN. Final Clinical Impressions(s) / UC Diagnoses   Final diagnoses:  Positive pregnancy test  Pregnancy with fetus of unknown gestational age     Discharge Instructions      Your blood work should result within the next 1 to 2 days.  Someone from our office will contact you via MyChart or via phone to give you the results.  Blood work will give [place] a better idea of how many weeks pregnant you are.  In the meantime start a daily prenatal vitamin.  If you experience any severe cramping or vaginal bleeding go to the [facility] for immediate evaluation.     ED Prescriptions     Medication Sig Dispense Auth. Provider   Prenatal MV & Min w/FA-DHA (PRENATAL ADULT GUMMY/DHA/FA) 0.4-25 MG CHEW Chew 1 tablet by mouth daily. 180 tablet Bing Neighbors, NP      PDMP not reviewed this encounter.   Bing Neighbors, NP 11/16/22 [phone]

## 2022-11-18 LAB — SPECIMEN STATUS REPORT

## 2022-11-18 LAB — BETA HCG QUANT (REF LAB): hCG Quant: 126698 m[IU]/mL

## 2022-12-12 ENCOUNTER — Other Ambulatory Visit (HOSPITAL_COMMUNITY)
Admission: RE | Admit: 2022-12-12 | Discharge: 2022-12-12 | Disposition: A | Discharge status: Home / Self Care | Payer: Medicaid Other | Source: Ambulatory Visit | Attending: Obstetrics and Gynecology | Admitting: Obstetrics and Gynecology

## 2022-12-12 ENCOUNTER — Ambulatory Visit: Payer: Medicaid Other

## 2022-12-12 ENCOUNTER — Other Ambulatory Visit (INDEPENDENT_AMBULATORY_CARE_PROVIDER_SITE_OTHER): Payer: Medicaid Other

## 2022-12-12 VITALS — BP 106/68 | HR 82 | Wt 126.2 lb

## 2022-12-12 DIAGNOSIS — Z3A12 12 weeks gestation of pregnancy: Secondary | ICD-10-CM

## 2022-12-12 DIAGNOSIS — O0990 Supervision of high risk pregnancy, unspecified, unspecified trimester: Secondary | ICD-10-CM | POA: Insufficient documentation

## 2022-12-12 DIAGNOSIS — Z3481 Encounter for supervision of other normal pregnancy, first trimester: Secondary | ICD-10-CM

## 2022-12-12 DIAGNOSIS — O3680X0 Pregnancy with inconclusive fetal viability, not applicable or unspecified: Secondary | ICD-10-CM

## 2022-12-12 DIAGNOSIS — O09299 Supervision of pregnancy with other poor reproductive or obstetric history, unspecified trimester: Secondary | ICD-10-CM

## 2022-12-12 MED ORDER — BLOOD PRESSURE KIT DEVI: 1.0000 | 0 refills | Status: DC

## 2022-12-12 MED ORDER — GOJJI WEIGHT SCALE MISC: 1.0000 | 0 refills | Status: DC

## 2022-12-12 NOTE — Progress Notes (Signed)
New OB Intake  I connected with Kelly Oneal  on 12/12/22 at  1:10 PM EDT by in person Video Visit and verified that I am speaking with the correct person using two identifiers. Nurse is located at [facility] and pt is located at [place].  I discussed the limitations, risks, security and privacy concerns of performing an evaluation and management service by telephone and the availability of in person appointments. I also discussed with the patient that there may be a patient responsible charge related to this service. The patient expressed understanding and agreed to proceed.  I explained I am completing New OB Intake today. We discussed EDD of 06/25/23 that is based on first trimester u/s at 12 weeks. Pt is G3/P2002. I reviewed her allergies, medications, Medical/Surgical/OB history, and appropriate screenings. I informed her of [facility] services. [facility] information placed in AVS. Based on history, this is a high risk pregnancy.  Patient Active Problem List   Diagnosis Date Noted   Amenorrhea, secondary 07/01/2013   Annual physical exam 07/01/2013   Umbilical hernia 07/01/2013   Dental caries 07/01/2013    Concerns addressed today  Delivery Plans Plans to deliver at [facility]. Patient given information for [facility] Healthy Baby website for more information about [facility]. Patient is interested in water birth. Offered upcoming OB visit with CNM to discuss further.  MyChart/Babyscripts MyChart access verified. I explained pt will have some visits in office and some virtually. Babyscripts instructions given and order placed. Patient verifies receipt of registration text/e-mail. Account successfully created and app downloaded.  Blood Pressure Cuff/Weight Scale Blood pressure cuff ordered for patient to pick-up from [employer]. Explained after first prenatal appt pt will check weekly and document in Babyscripts. Patient does not have weight scale; order sent to  [facility], patient may track weight weekly in Babyscripts.  Anatomy US Explained first scheduled [place] will be around 19 weeks. Dating and viability scan performed today. Anatomy US TBD. Labs Discussed Avelina Laine genetic screening with patient. Would like both Panorama and Horizon drawn at new OB visit. Routine prenatal labs needed.  COVID Vaccine Patient has not had COVID vaccine.   Is patient a CenteringPregnancy candidate?  Declined Declined due to Group setting Not a candidate due to  If accepted,   Social Determinants of Health Food Insecurity: Patient denies food insecurity. WIC Referral: Patient is interested in referral to [facility].  Transportation: Patient denies transportation needs. Childcare: Discussed no children allowed at ultrasound appointments. Offered childcare services; patient declines childcare services at this time.  Interested in [place]? If yes, send referral and doula dot phrase.   First visit review I reviewed new OB appt with patient. I explained they will have a provider visit that includes discuss plan of care for pregnancy. Explained pt will be seen by Coral Ceo at first visit; encounter routed to appropriate provider. Explained that patient will be seen by pregnancy navigator following visit with provider.   Hamilton Capri, RN 12/12/2022  1:28 PM

## 2022-12-13 LAB — CBC/D/PLT+RPR+RH+ABO+RUBIGG...
Antibody Screen: NEGATIVE
Basophils Absolute: 0 10*3/uL (ref 0.0–0.2)
Basos: 0 %
EOS (ABSOLUTE): 0.1 10*3/uL (ref 0.0–0.4)
Eos: 1 %
HCV Ab: NONREACTIVE
HIV Screen 4th Generation wRfx: NONREACTIVE
Hematocrit: 39 % (ref 34.0–46.6)
Hemoglobin: 12.5 g/dL (ref 11.1–15.9)
Hepatitis B Surface Ag: NEGATIVE
Immature Grans (Abs): 0 10*3/uL (ref 0.0–0.1)
Immature Granulocytes: 0 %
Lymphocytes Absolute: 2 10*3/uL (ref 0.7–3.1)
Lymphs: 21 %
MCH: 27.2 pg (ref 26.6–33.0)
MCHC: 32.1 g/dL (ref 31.5–35.7)
MCV: 85 fL (ref 79–97)
Monocytes Absolute: 0.6 10*3/uL (ref 0.1–0.9)
Monocytes: 6 %
Neutrophils Absolute: 6.7 10*3/uL (ref 1.4–7.0)
Neutrophils: 72 %
Platelets: 283 10*3/uL (ref 150–450)
RBC: 4.6 x10E6/uL (ref 3.77–5.28)
RDW: 13.3 % (ref 11.7–15.4)
RPR Ser Ql: NONREACTIVE
Rh Factor: POSITIVE
Rubella Antibodies, IGG: 6.47 index (ref >0.99)
WBC: 9.4 10*3/uL (ref 3.4–10.8)

## 2022-12-13 LAB — CERVICOVAGINAL ANCILLARY ONLY
Chlamydia: NEGATIVE
Comment: NEGATIVE
Comment: NEGATIVE
Comment: NORMAL
Neisseria Gonorrhea: NEGATIVE
Trichomonas: NEGATIVE

## 2022-12-13 LAB — HCV INTERPRETATION

## 2022-12-14 LAB — URINE CULTURE, OB REFLEX

## 2022-12-14 LAB — CULTURE, OB URINE

## 2022-12-19 LAB — PANORAMA PRENATAL TEST FULL PANEL:PANORAMA TEST PLUS 5 ADDITIONAL MICRODELETIONS: FETAL FRACTION: 11.6

## 2022-12-21 LAB — HORIZON CUSTOM: REPORT SUMMARY: NEGATIVE

## 2023-01-03 ENCOUNTER — Ambulatory Visit (INDEPENDENT_AMBULATORY_CARE_PROVIDER_SITE_OTHER): Payer: Medicaid Other | Admitting: Obstetrics

## 2023-01-03 VITALS — BP 89/60 | HR 74 | Wt 132.0 lb

## 2023-01-03 DIAGNOSIS — Z1339 Encounter for screening examination for other mental health and behavioral disorders: Secondary | ICD-10-CM | POA: Diagnosis not present

## 2023-01-03 DIAGNOSIS — Z3480 Encounter for supervision of other normal pregnancy, unspecified trimester: Secondary | ICD-10-CM

## 2023-01-03 DIAGNOSIS — O0990 Supervision of high risk pregnancy, unspecified, unspecified trimester: Secondary | ICD-10-CM

## 2023-01-03 DIAGNOSIS — Z3A15 15 weeks gestation of pregnancy: Secondary | ICD-10-CM

## 2023-01-03 DIAGNOSIS — O0992 Supervision of high risk pregnancy, unspecified, second trimester: Secondary | ICD-10-CM | POA: Diagnosis not present

## 2023-01-03 NOTE — Progress Notes (Signed)
Subjective:    Kelly Oneal is being seen today for her first obstetrical visit.  This {is/is not:9024} a planned pregnancy. She is at [redacted]w[redacted]d gestation. Her obstetrical history is significant for {ob risk factors:10154}. Relationship with FOB: {fob:16621}. Patient {does/does not:19097} intend to breast feed. Pregnancy history fully reviewed.  The information documented in the HPI was reviewed and verified.  Menstrual History: OB History     Gravida  3   Para  2   Term  2   Preterm      AB      Living  2      SAB      IAB      Ectopic      Multiple      Live Births  2           Menarche age: *** Patient's last menstrual period was 09/04/2022.    Past Medical History:  Diagnosis Date   Anemia    Cardiomegaly    Dyspnea    Hx of pre-eclampsia in prior pregnancy, currently pregnant    Kidney stones    Pulmonary hypertension (HCC)     No past surgical history on file.  (Not in a hospital admission)  Allergies  Allergen Reactions   Food Color Red Diarrhea   Penicillins Rash    Social History   Tobacco Use   Smoking status: Never    Passive exposure: Never   Smokeless tobacco: Never  Substance Use Topics   Alcohol use: No    Family History  Problem Relation Age of Onset   Hypertension Maternal Grandmother    Hypertension Maternal Grandfather      Review of Systems Constitutional: negative for weight loss Gastrointestinal: negative for vomiting Genitourinary:negative for genital lesions and vaginal discharge and dysuria Musculoskeletal:negative for back pain Behavioral/Psych: negative for abusive relationship, depression, illegal drug usage and tobacco use    Objective:    BP (!) 89/60   Pulse 74   Wt 132 lb (59.9 kg)   LMP 09/04/2022 Comment: states she thinks it was in February  of 2024  BMI 26.97 kg/m  General Appearance:    Alert, cooperative, no distress, appears stated age  Head:    Normocephalic, without obvious  abnormality, atraumatic  Eyes:    PERRL, conjunctiva/corneas clear, EOM's intact, fundi    benign, both eyes  Ears:    Normal TM's and external ear canals, both ears  Nose:   Nares normal, septum midline, mucosa normal, no drainage    or sinus tenderness  Throat:   Lips, mucosa, and tongue normal; teeth and gums normal  Neck:   Supple, symmetrical, trachea midline, no adenopathy;    thyroid:  no enlargement/tenderness/nodules; no carotid   bruit or JVD  Back:     Symmetric, no curvature, ROM normal, no CVA tenderness  Lungs:     Clear to auscultation bilaterally, respirations unlabored  Chest Wall:    No tenderness or deformity   Heart:    Regular rate and rhythm, S1 and S2 normal, no murmur, rub   or gallop  Breast Exam:    No tenderness, masses, or nipple abnormality  Abdomen:     Soft, non-tender, bowel sounds active all four quadrants,    no masses, no organomegaly  Genitalia:    Normal female without lesion, discharge or tenderness  Extremities:   Extremities normal, atraumatic, no cyanosis or edema  Pulses:   2+ and symmetric all extremities  Skin:  Skin color, texture, turgor normal, no rashes or lesions  Lymph nodes:   Cervical, supraclavicular, and axillary nodes normal  Neurologic:   CNII-XII intact, normal strength, sensation and reflexes    throughout      Lab Review Urine pregnancy test Labs reviewed {YES NO:22349} Radiologic studies reviewed {YES NO:22349} Assessment:    Pregnancy at [redacted]w[redacted]d weeks    Plan:      Prenatal vitamins.  Counseling provided regarding continued use of seat belts, cessation of alcohol consumption, smoking or use of illicit drugs; infection precautions i.e., influenza/TDAP immunizations, toxoplasmosis,CMV, parvovirus, listeria and varicella; workplace safety, exercise during pregnancy; routine dental care, safe medications, sexual activity, hot tubs, saunas, pools, travel, caffeine use, fish and methlymercury, potential toxins, hair  treatments, varicose veins Weight gain recommendations per IOM guidelines reviewed: underweight/BMI< 18.5--> gain 28 - 40 lbs; normal weight/BMI 18.5 - 24.9--> gain 25 - 35 lbs; overweight/BMI 25 - 29.9--> gain 15 - 25 lbs; obese/BMI >30->gain  11 - 20 lbs Problem list reviewed and updated. FIRST/CF mutation testing/NIPT/QUAD SCREEN/fragile X/Ashkenazi Jewish population testing/Spinal muscular atrophy discussed: {requests/ordered/declines:14581}. Role of ultrasound in pregnancy discussed; fetal survey: {requests/ordered/declines:14581}. Amniocentesis discussed: {amniocentesis:14582}. VBAC calculator score: VBAC consent form provided No orders of the defined types were placed in this encounter.  Orders Placed This Encounter  Procedures   AFP, Serum, Open Spina Bifida    Order Specific Question:   Is patient insulin dependent?    Answer:   No    Order Specific Question:   Weight (lbs)    Answer:   132    Order Specific Question:   Gestational Age (GA), weeks    Answer:   15.2    Order Specific Question:   Date on which patient was at this GA    Answer:   01/03/2023    Order Specific Question:   GA Calculation Method    Answer:   LMP    Order Specific Question:   Number of fetuses    Answer:   1    Order Specific Question:   Reason for screen    Answer:   PPREGN   HgB A1c    Follow up in {numbers 0-4:31231} weeks. ***% of *** min visit spent on counseling and coordination of care.   Brock Bad, MD 01/03/2023 10:26 AM

## 2023-01-03 NOTE — Progress Notes (Signed)
NOB, reports no complaints today. 

## 2023-01-04 LAB — HEMOGLOBIN A1C
Est. average glucose Bld gHb Est-mCnc: 111 mg/dL
Hgb A1c MFr Bld: 5.5 % (ref 4.8–5.6)

## 2023-01-05 LAB — AFP, SERUM, OPEN SPINA BIFIDA
AFP MoM: 0.94
AFP Value: 31 ng/mL
Gest. Age on Collection Date: 15.2 weeks
Maternal Age At EDD: 41.9 yr
OSBR Risk 1 IN: 10000
Test Results:: NEGATIVE
Weight: 132 [lb_av]

## 2023-01-18 ENCOUNTER — Telehealth: Payer: Self-pay | Admitting: Emergency Medicine

## 2023-01-18 NOTE — Telephone Encounter (Signed)
RC to patient, answered questions about weight gain/loss in pregnancy.

## 2023-01-22 ENCOUNTER — Ambulatory Visit (INDEPENDENT_AMBULATORY_CARE_PROVIDER_SITE_OTHER): Payer: Medicaid Other | Admitting: Obstetrics & Gynecology

## 2023-01-22 VITALS — BP 99/62 | HR 67 | Wt 131.8 lb

## 2023-01-22 DIAGNOSIS — O0990 Supervision of high risk pregnancy, unspecified, unspecified trimester: Secondary | ICD-10-CM

## 2023-01-22 DIAGNOSIS — O09299 Supervision of pregnancy with other poor reproductive or obstetric history, unspecified trimester: Secondary | ICD-10-CM

## 2023-01-22 MED ORDER — ASPIRIN 81 MG PO TBEC
81.0000 mg | DELAYED_RELEASE_TABLET | Freq: Every day | ORAL | 12 refills | Status: DC
Start: 2023-01-22 — End: 2023-06-21

## 2023-01-22 NOTE — Progress Notes (Signed)
   PRENATAL VISIT NOTE  Subjective:  Kelly Oneal is a 42 y.o. G3P2002 at [redacted]w[redacted]d being seen today for ongoing prenatal care.  She is currently monitored for the following issues for this high-risk pregnancy and has Hx of pre-eclampsia in prior pregnancy, currently pregnant and Supervision of high risk pregnancy, antepartum on their problem list.  Patient reports no complaints.  Contractions: Not present. Vag. Bleeding: None.  Movement: Present. Denies leaking of fluid.   The following portions of the patient's history were reviewed and updated as appropriate: allergies, current medications, past family history, past medical history, past social history, past surgical history and problem list.   Objective:   Vitals:   01/22/23 1403  BP: 99/62  Pulse: 67  Weight: 131 lb 12.8 oz (59.8 kg)    Fetal Status: Fetal Heart Rate (bpm): 155   Movement: Present     General:  Alert, oriented and cooperative. Patient is in no acute distress.  Skin: Skin is warm and dry. No rash noted.   Cardiovascular: Normal heart rate noted  Respiratory: Normal respiratory effort, no problems with respiration noted  Abdomen: Soft, gravid, appropriate for gestational age.  Pain/Pressure: Present     Pelvic: Cervical exam deferred        Extremities: Normal range of motion.  Edema: Trace  Mental Status: Normal mood and affect. Normal behavior. Normal judgment and thought content.   Assessment and Plan:  Pregnancy: G3P2002 at [redacted]w[redacted]d 1. Supervision of high risk pregnancy, antepartum [place] soon  2. Hx of pre-eclampsia in prior pregnancy, currently pregnant ASA daily  Preterm labor symptoms and general obstetric precautions including but not limited to vaginal bleeding, contractions, leaking of fluid and fetal movement were reviewed in detail with the patient. Please refer to After Visit Summary for other counseling recommendations.   Return in about 4 weeks (around 02/19/2023).  Future  Appointments  Date Time Provider Department Center  02/06/2023  7:30 AM [facility] NURSE [facility]  02/06/2023  7:45 AM [facility] US4 [facility]  02/19/2023  8:55 AM Adam Phenix, MD [facility] None    Scheryl Darter, MD

## 2023-01-22 NOTE — Progress Notes (Signed)
Pt presents for ROB. 

## 2023-02-01 ENCOUNTER — Ambulatory Visit: Payer: Medicaid Other

## 2023-02-01 DIAGNOSIS — O09529 Supervision of elderly multigravida, unspecified trimester: Secondary | ICD-10-CM | POA: Insufficient documentation

## 2023-02-06 ENCOUNTER — Other Ambulatory Visit: Payer: Self-pay | Admitting: *Deleted

## 2023-02-06 ENCOUNTER — Ambulatory Visit: Payer: Medicaid Other | Attending: Obstetrics

## 2023-02-06 ENCOUNTER — Ambulatory Visit: Payer: Medicaid Other | Admitting: *Deleted

## 2023-02-06 VITALS — BP 95/56 | HR 74

## 2023-02-06 DIAGNOSIS — Z3A20 20 weeks gestation of pregnancy: Secondary | ICD-10-CM | POA: Diagnosis not present

## 2023-02-06 DIAGNOSIS — O09292 Supervision of pregnancy with other poor reproductive or obstetric history, second trimester: Secondary | ICD-10-CM | POA: Diagnosis not present

## 2023-02-06 DIAGNOSIS — O09522 Supervision of elderly multigravida, second trimester: Secondary | ICD-10-CM

## 2023-02-06 DIAGNOSIS — O0990 Supervision of high risk pregnancy, unspecified, unspecified trimester: Secondary | ICD-10-CM | POA: Insufficient documentation

## 2023-02-06 DIAGNOSIS — O09299 Supervision of pregnancy with other poor reproductive or obstetric history, unspecified trimester: Secondary | ICD-10-CM

## 2023-02-19 ENCOUNTER — Encounter: Payer: Self-pay | Admitting: Obstetrics & Gynecology

## 2023-02-19 ENCOUNTER — Ambulatory Visit: Payer: Medicaid Other | Admitting: Obstetrics & Gynecology

## 2023-02-19 VITALS — BP 93/61 | HR 69 | Wt 139.0 lb

## 2023-02-19 DIAGNOSIS — O0990 Supervision of high risk pregnancy, unspecified, unspecified trimester: Secondary | ICD-10-CM

## 2023-02-19 DIAGNOSIS — O09522 Supervision of elderly multigravida, second trimester: Secondary | ICD-10-CM

## 2023-02-19 DIAGNOSIS — O09292 Supervision of pregnancy with other poor reproductive or obstetric history, second trimester: Secondary | ICD-10-CM

## 2023-02-19 DIAGNOSIS — Z3A22 22 weeks gestation of pregnancy: Secondary | ICD-10-CM

## 2023-02-19 DIAGNOSIS — O09299 Supervision of pregnancy with other poor reproductive or obstetric history, unspecified trimester: Secondary | ICD-10-CM

## 2023-02-19 DIAGNOSIS — O0992 Supervision of high risk pregnancy, unspecified, second trimester: Secondary | ICD-10-CM

## 2023-02-19 NOTE — Progress Notes (Signed)
   PRENATAL VISIT NOTE  Subjective:  Kelly Oneal is a 42 y.o. G3P2002 at [redacted]w[redacted]d being seen today for ongoing prenatal care.  She is currently monitored for the following issues for this high-risk pregnancy and has Hx of pre-eclampsia in prior pregnancy, currently pregnant; Supervision of high risk pregnancy, antepartum; and AMA (advanced maternal age) multigravida 35+ on their problem list.  Patient reports no complaints.  Contractions: Not present. Vag. Bleeding: None.  Movement: Present. Denies leaking of fluid.   The following portions of the patient's history were reviewed and updated as appropriate: allergies, current medications, past family history, past medical history, past social history, past surgical history and problem list.   Objective:   Vitals:   02/19/23 0856  BP: 93/61  Pulse: 69  Weight: 139 lb (63 kg)    Fetal Status: Fetal Heart Rate (bpm): 140   Movement: Present     General:  Alert, oriented and cooperative. Patient is in no acute distress.  Skin: Skin is warm and dry. No rash noted.   Cardiovascular: Normal heart rate noted  Respiratory: Normal respiratory effort, no problems with respiration noted  Abdomen: Soft, gravid, appropriate for gestational age.  Pain/Pressure: Absent     Pelvic: Cervical exam deferred        Extremities: Normal range of motion.     Mental Status: Normal mood and affect. Normal behavior. Normal judgment and thought content.   Assessment and Plan:  Pregnancy: G3P2002 at [redacted]w[redacted]d 1. Supervision of high risk pregnancy, antepartum Plans BTL  2. Multigravida of advanced maternal age in second trimester   3. Hx of pre-eclampsia in prior pregnancy, currently pregnant Nl BP  Preterm labor symptoms and general obstetric precautions including but not limited to vaginal bleeding, contractions, leaking of fluid and fetal movement were reviewed in detail with the patient. Please refer to After Visit Summary for other  counseling recommendations.   Return in about 4 weeks (around 03/19/2023).  Future Appointments  Date Time Provider Department Center  04/03/2023 10:30 AM [facility] US[facility]  05/01/2023 10:30 AM [facility] US[facility]    Scheryl Darter, MD

## 2023-03-19 ENCOUNTER — Encounter: Payer: Self-pay | Admitting: Obstetrics and Gynecology

## 2023-03-19 ENCOUNTER — Ambulatory Visit (INDEPENDENT_AMBULATORY_CARE_PROVIDER_SITE_OTHER): Payer: Medicaid Other | Admitting: Obstetrics and Gynecology

## 2023-03-19 VITALS — BP 90/60 | HR 74 | Wt 146.0 lb

## 2023-03-19 DIAGNOSIS — O09299 Supervision of pregnancy with other poor reproductive or obstetric history, unspecified trimester: Secondary | ICD-10-CM

## 2023-03-19 DIAGNOSIS — O09292 Supervision of pregnancy with other poor reproductive or obstetric history, second trimester: Secondary | ICD-10-CM

## 2023-03-19 DIAGNOSIS — Z3009 Encounter for other general counseling and advice on contraception: Secondary | ICD-10-CM

## 2023-03-19 DIAGNOSIS — O0992 Supervision of high risk pregnancy, unspecified, second trimester: Secondary | ICD-10-CM

## 2023-03-19 DIAGNOSIS — O09522 Supervision of elderly multigravida, second trimester: Secondary | ICD-10-CM

## 2023-03-19 DIAGNOSIS — O0990 Supervision of high risk pregnancy, unspecified, unspecified trimester: Secondary | ICD-10-CM

## 2023-03-19 DIAGNOSIS — Z3A26 26 weeks gestation of pregnancy: Secondary | ICD-10-CM

## 2023-03-19 NOTE — Patient Instructions (Signed)

## 2023-03-19 NOTE — Progress Notes (Signed)
Subjective:  Kelly Oneal is a 42 y.o. G3P2002 at [redacted]w[redacted]d being seen today for ongoing prenatal care.  She is currently monitored for the following issues for this high-risk pregnancy and has Hx of pre-eclampsia in prior pregnancy, currently pregnant; Supervision of high risk pregnancy, antepartum; and AMA (advanced maternal age) multigravida 35+ on their problem list.  Patient reports no complaints.  Contractions: Irritability. Vag. Bleeding: None.  Movement: Present. Denies leaking of fluid.   The following portions of the patient's history were reviewed and updated as appropriate: allergies, current medications, past family history, past medical history, past social history, past surgical history and problem list. Problem list updated.  Objective:   Vitals:   03/19/23 0838  BP: 90/60  Pulse: 74  Weight: 146 lb (66.2 kg)    Fetal Status: Fetal Heart Rate (bpm): 143   Movement: Present     General:  Alert, oriented and cooperative. Patient is in no acute distress.  Skin: Skin is warm and dry. No rash noted.   Cardiovascular: Normal heart rate noted  Respiratory: Normal respiratory effort, no problems with respiration noted  Abdomen: Soft, gravid, appropriate for gestational age. Pain/Pressure: Absent     Pelvic:  Cervical exam deferred        Extremities: Normal range of motion.  Edema: None  Mental Status: Normal mood and affect. Normal behavior. Normal judgment and thought content.   Urinalysis:      Assessment and Plan:  Pregnancy: G3P2002 at [redacted]w[redacted]d  1. Supervision of high risk pregnancy, antepartum [O09.90] Stable - Glucose Tolerance, 2 Hours w/1 Hour - RPR - CBC - HIV antibody (with reflex)  2. Multigravida of advanced maternal age in second trimester Genetic's negative Serial growth scans  3. Hx of pre-eclampsia in prior pregnancy, currently pregnant BP nl No S/Sx at present  Preterm labor symptoms and general obstetric precautions including but not  limited to vaginal bleeding, contractions, leaking of fluid and fetal movement were reviewed in detail with the patient. Please refer to After Visit Summary for other counseling recommendations.  No follow-ups on file.   Hermina Staggers, MD

## 2023-03-20 LAB — CBC
Hematocrit: 32.4 % — ABNORMAL LOW (ref 34.0–46.6)
Hemoglobin: 10.3 g/dL — ABNORMAL LOW (ref 11.1–15.9)
MCH: 26.9 pg (ref 26.6–33.0)
MCHC: 31.8 g/dL (ref 31.5–35.7)
MCV: 85 fL (ref 79–97)
Platelets: 267 10*3/uL (ref 150–450)
RBC: 3.83 x10E6/uL (ref 3.77–5.28)
RDW: 12.8 % (ref 11.7–15.4)
WBC: 9.1 10*3/uL (ref 3.4–10.8)

## 2023-03-20 LAB — GLUCOSE TOLERANCE, 2 HOURS W/ 1HR
Glucose, 1 hour: 118 mg/dL (ref 70–179)
Glucose, 2 hour: 118 mg/dL (ref 70–152)
Glucose, Fasting: 77 mg/dL (ref 70–91)

## 2023-03-20 LAB — RPR: RPR Ser Ql: NONREACTIVE

## 2023-03-20 LAB — HIV ANTIBODY (ROUTINE TESTING W REFLEX): HIV Screen 4th Generation wRfx: NONREACTIVE

## 2023-04-03 ENCOUNTER — Ambulatory Visit: Payer: Medicaid Other | Attending: Obstetrics and Gynecology

## 2023-04-03 ENCOUNTER — Other Ambulatory Visit: Payer: Self-pay | Admitting: *Deleted

## 2023-04-03 DIAGNOSIS — O09523 Supervision of elderly multigravida, third trimester: Secondary | ICD-10-CM

## 2023-04-03 DIAGNOSIS — O09293 Supervision of pregnancy with other poor reproductive or obstetric history, third trimester: Secondary | ICD-10-CM

## 2023-04-03 DIAGNOSIS — O09522 Supervision of elderly multigravida, second trimester: Secondary | ICD-10-CM | POA: Insufficient documentation

## 2023-04-03 DIAGNOSIS — Z3A28 28 weeks gestation of pregnancy: Secondary | ICD-10-CM | POA: Diagnosis not present

## 2023-04-03 DIAGNOSIS — O09299 Supervision of pregnancy with other poor reproductive or obstetric history, unspecified trimester: Secondary | ICD-10-CM | POA: Diagnosis present

## 2023-04-23 ENCOUNTER — Encounter: Payer: Self-pay | Admitting: Advanced Practice Midwife

## 2023-04-23 ENCOUNTER — Ambulatory Visit (INDEPENDENT_AMBULATORY_CARE_PROVIDER_SITE_OTHER): Payer: Medicaid Other | Admitting: Advanced Practice Midwife

## 2023-04-23 VITALS — BP 97/60 | HR 81 | Wt 153.2 lb

## 2023-04-23 DIAGNOSIS — O09522 Supervision of elderly multigravida, second trimester: Secondary | ICD-10-CM

## 2023-04-23 DIAGNOSIS — O0990 Supervision of high risk pregnancy, unspecified, unspecified trimester: Secondary | ICD-10-CM

## 2023-04-23 DIAGNOSIS — Z3A31 31 weeks gestation of pregnancy: Secondary | ICD-10-CM

## 2023-04-23 DIAGNOSIS — O09299 Supervision of pregnancy with other poor reproductive or obstetric history, unspecified trimester: Secondary | ICD-10-CM

## 2023-04-23 NOTE — Progress Notes (Signed)
Pt. Presents for ROB. Pt. Complains of shortness of breath

## 2023-04-23 NOTE — Progress Notes (Signed)
   PRENATAL VISIT NOTE  Subjective:  Kelly Oneal is a 42 y.o. G3P2002 at [redacted]w[redacted]d being seen today for ongoing prenatal care.  She is currently monitored for the following issues for this high-risk pregnancy and has Hx of pre-eclampsia in prior pregnancy, currently pregnant; Supervision of high risk pregnancy, antepartum; AMA (advanced maternal age) multigravida 35+; and Unwanted fertility on their problem list.  Patient reports no complaints.  Contractions: Irritability. Vag. Bleeding: None.  Movement: Present. Denies leaking of fluid.   The following portions of the patient's history were reviewed and updated as appropriate: allergies, current medications, past family history, past medical history, past social history, past surgical history and problem list.   Objective:   Vitals:   04/23/23 0857  BP: 97/60  Pulse: 81  Weight: 153 lb 3.2 oz (69.5 kg)    Fetal Status: Fetal Heart Rate (bpm): 135 Fundal Height: 31 cm Movement: Present     General:  Alert, oriented and cooperative. Patient is in no acute distress.  Skin: Skin is warm and dry. No rash noted.   Cardiovascular: Normal heart rate noted  Respiratory: Normal respiratory effort, no problems with respiration noted  Abdomen: Soft, gravid, appropriate for gestational age.  Pain/Pressure: Absent     Pelvic: Cervical exam deferred        Extremities: Normal range of motion.  Edema: None  Mental Status: Normal mood and affect. Normal behavior. Normal judgment and thought content.   Assessment and Plan:  Pregnancy: G3P2002 at [redacted]w[redacted]d 1. Supervision of high risk pregnancy, antepartum [O09.90] --Anticipatory guidance about next visits/weeks of pregnancy given.  - Pt is interested in waterbirth.  No contraindications at this time per chart review/patient assessment.   - Pt to enroll in class, see CNMs for most visits in the office.  - Discussed waterbirth as option for low-risk pregnancy.  Reviewed conditions that may  arise during pregnancy that will risk pt out of waterbirth including hypertension, diabetes, fetal growth restriction <10%ile, etc.   2. Multigravida of advanced maternal age in second trimester   3. Hx of pre-eclampsia in prior pregnancy, currently pregnant --With first pregnancy, second pregnancy uncomplicated  4. [redacted] weeks gestation of pregnancy   Preterm labor symptoms and general obstetric precautions including but not limited to vaginal bleeding, contractions, leaking of fluid and fetal movement were reviewed in detail with the patient. Please refer to After Visit Summary for other counseling recommendations.   No follow-ups on file.  Future Appointments  Date Time Provider Department Center  05/01/2023 10:30 AM [facility] US1 [facility]  05/08/2023  8:55 AM Hessie Dibble, MD [facility] None  05/15/2023  9:45 AM [facility] NST [facility]  05/22/2023  8:55 AM Nobie Putnam, Cyndi Lennert, MD [facility] None  05/22/2023  9:45 AM [facility] NST [facility]  05/29/2023  9:30 AM [facility] US2 [facility]  06/05/2023  8:55 AM Cresenzo, Cyndi Lennert, MD [facility] None    Sharen Counter, CNM

## 2023-05-01 ENCOUNTER — Other Ambulatory Visit: Payer: Self-pay

## 2023-05-01 ENCOUNTER — Ambulatory Visit: Payer: Medicaid Other | Attending: Obstetrics and Gynecology

## 2023-05-01 DIAGNOSIS — O09523 Supervision of elderly multigravida, third trimester: Secondary | ICD-10-CM

## 2023-05-01 DIAGNOSIS — O09522 Supervision of elderly multigravida, second trimester: Secondary | ICD-10-CM | POA: Diagnosis present

## 2023-05-01 DIAGNOSIS — O09293 Supervision of pregnancy with other poor reproductive or obstetric history, third trimester: Secondary | ICD-10-CM

## 2023-05-01 DIAGNOSIS — O09299 Supervision of pregnancy with other poor reproductive or obstetric history, unspecified trimester: Secondary | ICD-10-CM | POA: Insufficient documentation

## 2023-05-01 DIAGNOSIS — Z3A32 32 weeks gestation of pregnancy: Secondary | ICD-10-CM

## 2023-05-08 ENCOUNTER — Encounter: Payer: Self-pay | Admitting: Advanced Practice Midwife

## 2023-05-08 ENCOUNTER — Ambulatory Visit (INDEPENDENT_AMBULATORY_CARE_PROVIDER_SITE_OTHER): Payer: Medicaid Other | Admitting: Advanced Practice Midwife

## 2023-05-08 VITALS — BP 95/60 | HR 78 | Wt 158.6 lb

## 2023-05-08 DIAGNOSIS — Z3009 Encounter for other general counseling and advice on contraception: Secondary | ICD-10-CM

## 2023-05-08 DIAGNOSIS — O99353 Diseases of the nervous system complicating pregnancy, third trimester: Secondary | ICD-10-CM

## 2023-05-08 DIAGNOSIS — Z3A33 33 weeks gestation of pregnancy: Secondary | ICD-10-CM

## 2023-05-08 DIAGNOSIS — O09522 Supervision of elderly multigravida, second trimester: Secondary | ICD-10-CM

## 2023-05-08 DIAGNOSIS — O09299 Supervision of pregnancy with other poor reproductive or obstetric history, unspecified trimester: Secondary | ICD-10-CM

## 2023-05-08 DIAGNOSIS — K429 Umbilical hernia without obstruction or gangrene: Secondary | ICD-10-CM

## 2023-05-08 DIAGNOSIS — G5600 Carpal tunnel syndrome, unspecified upper limb: Secondary | ICD-10-CM

## 2023-05-08 DIAGNOSIS — O0990 Supervision of high risk pregnancy, unspecified, unspecified trimester: Secondary | ICD-10-CM

## 2023-05-08 MED ORDER — WRIST BRACE/RIGHT SMALL MISC
1.0000 | Freq: Every day | 0 refills | Status: DC
Start: 2023-05-08 — End: 2023-06-21

## 2023-05-08 NOTE — Progress Notes (Signed)
Pt presents for ROB visit. Pt c/o tingling in the right hand and shortness of breath

## 2023-05-08 NOTE — Progress Notes (Signed)
   PRENATAL VISIT NOTE  Subjective:  Kelly Oneal is a 42 y.o. G3P2002 at [redacted]w[redacted]d being seen today for ongoing prenatal care.  She is currently monitored for the following issues for this high-risk pregnancy and has Hx of pre-eclampsia in prior pregnancy, currently pregnant; Supervision of high risk pregnancy, antepartum; AMA (advanced maternal age) multigravida 35+; and Unwanted fertility on their problem list.  Patient reports  tingling in fingers of right hand x 2 weeks .  Contractions: Irritability. Vag. Bleeding: None.  Movement: Present. Denies leaking of fluid.   The following portions of the patient's history were reviewed and updated as appropriate: allergies, current medications, past family history, past medical history, past social history, past surgical history and problem list.   Objective:   Vitals:   05/08/23 0909  BP: 95/60  Pulse: 78  Weight: 158 lb 9.6 oz (71.9 kg)    Fetal Status: Fetal Heart Rate (bpm): 141 Fundal Height: 33 cm Movement: Present     General:  Alert, oriented and cooperative. Patient is in no acute distress.  Skin: Skin is warm and dry. No rash noted.   Cardiovascular: Normal heart rate noted  Respiratory: Normal respiratory effort, no problems with respiration noted  Abdomen: Soft, gravid, appropriate for gestational age.  Pain/Pressure: Absent     Pelvic: Cervical exam deferred        Extremities: Normal range of motion.  Edema: None  Mental Status: Normal mood and affect. Normal behavior. Normal judgment and thought content.   Assessment and Plan:  Pregnancy: G3P2002 at [redacted]w[redacted]d 1. Supervision of high risk pregnancy, antepartum [O09.90] --Anticipatory guidance about next visits/weeks of pregnancy given.  --High risk for AMA - Pt enrolled in WB class in 1 week, no barriers at this time  2. Multigravida of advanced maternal age in second trimester   3. Hx of pre-eclampsia in prior pregnancy, currently pregnant --BP wnl, no s/sx  of PEC  4. [redacted] weeks gestation of pregnancy   5. Unwanted fertility --Plans BTL at hospital, has umbilical hernia.  Questions about whether BTL incision will be possible. Consult Dr Jolayne Panther. Attending on at time of BTL will need to assess abdomen.  Sometimes general surgery is consulted.  If BTL is performed, referral to general surgery for hernia repair at later date.  6. Umbilical hernia without obstruction and without gangrene    7. Pregnancy related carpal tunnel syndrome in third trimester  - Elastic Bandages & Supports (WRIST BRACE/RIGHT SMALL) MISC; 1 Device by Does not apply route daily.  Dispense: 1 each; Refill: 0   Preterm labor symptoms and general obstetric precautions including but not limited to vaginal bleeding, contractions, leaking of fluid and fetal movement were reviewed in detail with the patient. Please refer to After Visit Summary for other counseling recommendations.   Return in about 2 weeks (around 05/22/2023) for Midwife preferred.  Future Appointments  Date Time Provider Department Center  05/15/2023  9:45 AM [facility] NST [facility]  05/22/2023  8:55 AM Nobie Putnam, Cyndi Lennert, MD [facility] None  05/22/2023  9:45 AM [facility] NST [facility]  05/29/2023  9:30 AM [facility] US2 [facility]  06/05/2023  8:55 AM Cresenzo, Cyndi Lennert, MD [facility] None    Sharen Counter, CNM

## 2023-05-15 ENCOUNTER — Other Ambulatory Visit: Payer: Self-pay

## 2023-05-15 ENCOUNTER — Ambulatory Visit: Payer: Medicaid Other | Attending: Maternal & Fetal Medicine | Admitting: *Deleted

## 2023-05-15 DIAGNOSIS — O09523 Supervision of elderly multigravida, third trimester: Secondary | ICD-10-CM | POA: Diagnosis present

## 2023-05-15 DIAGNOSIS — Z3A34 34 weeks gestation of pregnancy: Secondary | ICD-10-CM | POA: Insufficient documentation

## 2023-05-15 NOTE — Procedures (Signed)
Kelly Oneal 26-Jul-1980 [redacted]w[redacted]d  Fetus A Non-Stress Test Interpretation for 05/15/23  Indication: Advanced Maternal Age >40 years  Fetal Heart Rate A Mode: External Baseline Rate (A): 150 bpm Variability: Moderate Accelerations: 15 x 15 Decelerations: None Multiple birth?: No  Uterine Activity Mode: Palpation, Toco Contraction Frequency (min): UI Contraction Quality: Mild Resting Tone Palpated: Relaxed Resting Time: Adequate  Interpretation (Fetal Testing) Nonstress Test Interpretation: Reactive Comments: Dr. Parke Poisson reviewed tracing.

## 2023-05-22 ENCOUNTER — Other Ambulatory Visit: Payer: Self-pay

## 2023-05-22 ENCOUNTER — Ambulatory Visit: Payer: Medicaid Other | Attending: Maternal & Fetal Medicine | Admitting: *Deleted

## 2023-05-22 ENCOUNTER — Ambulatory Visit (INDEPENDENT_AMBULATORY_CARE_PROVIDER_SITE_OTHER): Payer: Medicaid Other | Admitting: Family Medicine

## 2023-05-22 VITALS — BP 104/68 | HR 79 | Wt 161.0 lb

## 2023-05-22 DIAGNOSIS — Z3009 Encounter for other general counseling and advice on contraception: Secondary | ICD-10-CM

## 2023-05-22 DIAGNOSIS — Z3A35 35 weeks gestation of pregnancy: Secondary | ICD-10-CM

## 2023-05-22 DIAGNOSIS — O09523 Supervision of elderly multigravida, third trimester: Secondary | ICD-10-CM | POA: Diagnosis present

## 2023-05-22 DIAGNOSIS — O0990 Supervision of high risk pregnancy, unspecified, unspecified trimester: Secondary | ICD-10-CM

## 2023-05-22 DIAGNOSIS — O09522 Supervision of elderly multigravida, second trimester: Secondary | ICD-10-CM

## 2023-05-22 DIAGNOSIS — O09299 Supervision of pregnancy with other poor reproductive or obstetric history, unspecified trimester: Secondary | ICD-10-CM

## 2023-05-22 NOTE — Procedures (Cosign Needed Addendum)
Rachna Epperson Darcey Dec 13, 1980 [redacted]w[redacted]d  Fetus A Non-Stress Test Interpretation for 05/22/23 (NST only)  Indication: Advanced Maternal Age >40 years  Fetal Heart Rate A Mode: External Baseline Rate (A): 130 bpm Variability: Moderate Accelerations: 15 x 15 Decelerations: None Multiple birth?: No  Uterine Activity Mode: Palpation, Toco Contraction Frequency (min): None Resting Tone Palpated: Relaxed Resting Time: Adequate  Interpretation (Fetal Testing) Nonstress Test Interpretation: Reactive Comments: Dr. Parke Poisson reviewed tracing.

## 2023-05-22 NOTE — Progress Notes (Signed)
Pt complains of lower left back pain that radiates down to foot.

## 2023-05-22 NOTE — Progress Notes (Signed)
   PRENATAL VISIT NOTE  Subjective:  Kelly Oneal is a 42 y.o. G3P2002 at [redacted]w[redacted]d being seen today for ongoing prenatal care.  She is currently monitored for the following issues for this high-risk pregnancy and has Hx of pre-eclampsia in prior pregnancy, currently pregnant; Supervision of high risk pregnancy, antepartum; AMA (advanced maternal age) multigravida 35+; and Unwanted fertility on their problem list.  Patient reports no bleeding, no cramping, no leaking, and occasional contractions.  Contractions: Irregular. Vag. Bleeding: None.  Movement: Present. Denies leaking of fluid.   The following portions of the patient's history were reviewed and updated as appropriate: allergies, current medications, past family history, past medical history, past social history, past surgical history and problem list.   Objective:   Vitals:   05/22/23 0906  BP: 104/68  Pulse: 79  Weight: 161 lb (73 kg)    Fetal Status: Fetal Heart Rate (bpm): 140   Movement: Present     General:  Alert, oriented and cooperative. Patient is in no acute distress.  Skin: Skin is warm and dry. No rash noted.   Cardiovascular: Normal heart rate noted  Respiratory: Normal respiratory effort, no problems with respiration noted  Abdomen: Soft, gravid, appropriate for gestational age.  Pain/Pressure: Present     Pelvic: Cervical exam deferred        Extremities: Normal range of motion.     Mental Status: Normal mood and affect. Normal behavior. Normal judgment and thought content.   Assessment and Plan:  Pregnancy: G3P2002 at [redacted]w[redacted]d 1. Supervision of high risk pregnancy, antepartum [O09.90] FHR and BP appropriate today Continue routine prenatal care  2. Multigravida of advanced maternal age in second trimester Continue monitoring per MFM  3. Hx of pre-eclampsia in prior pregnancy, currently pregnant BP within normal limits.  No signs of preeclampsia  4. Unwanted fertility Tubal papers previously  signed  5. [redacted] weeks gestation of pregnancy   Preterm labor symptoms and general obstetric precautions including but not limited to vaginal bleeding, contractions, leaking of fluid and fetal movement were reviewed in detail with the patient. Please refer to After Visit Summary for other counseling recommendations.   No follow-ups on file.  Future Appointments  Date Time Provider Department Center  05/22/2023  9:45 AM [facility] NST [facility]  05/29/2023  9:30 AM [facility] US2 [facility]  06/05/2023  8:55 AM Ascher Schroepfer, Cyndi Lennert, MD [facility] None    Celedonio Savage, MD

## 2023-05-29 ENCOUNTER — Other Ambulatory Visit: Payer: Self-pay | Admitting: Maternal & Fetal Medicine

## 2023-05-29 ENCOUNTER — Other Ambulatory Visit: Payer: Self-pay | Admitting: *Deleted

## 2023-05-29 ENCOUNTER — Other Ambulatory Visit: Payer: Self-pay

## 2023-05-29 ENCOUNTER — Ambulatory Visit: Payer: Medicaid Other | Attending: Maternal & Fetal Medicine

## 2023-05-29 DIAGNOSIS — Z3A36 36 weeks gestation of pregnancy: Secondary | ICD-10-CM

## 2023-05-29 DIAGNOSIS — O09293 Supervision of pregnancy with other poor reproductive or obstetric history, third trimester: Secondary | ICD-10-CM | POA: Diagnosis not present

## 2023-05-29 DIAGNOSIS — O09523 Supervision of elderly multigravida, third trimester: Secondary | ICD-10-CM

## 2023-06-05 ENCOUNTER — Other Ambulatory Visit (HOSPITAL_COMMUNITY)
Admission: RE | Admit: 2023-06-05 | Discharge: 2023-06-05 | Disposition: A | Discharge status: Home / Self Care | Payer: Medicaid Other | Source: Ambulatory Visit | Attending: Family Medicine | Admitting: Family Medicine

## 2023-06-05 ENCOUNTER — Ambulatory Visit (INDEPENDENT_AMBULATORY_CARE_PROVIDER_SITE_OTHER): Payer: Medicaid Other | Admitting: Family Medicine

## 2023-06-05 ENCOUNTER — Ambulatory Visit: Payer: Medicaid Other | Attending: Maternal & Fetal Medicine

## 2023-06-05 ENCOUNTER — Other Ambulatory Visit: Payer: Self-pay

## 2023-06-05 VITALS — BP 96/66 | HR 64 | Wt 163.2 lb

## 2023-06-05 DIAGNOSIS — O0990 Supervision of high risk pregnancy, unspecified, unspecified trimester: Secondary | ICD-10-CM | POA: Insufficient documentation

## 2023-06-05 DIAGNOSIS — O26893 Other specified pregnancy related conditions, third trimester: Secondary | ICD-10-CM

## 2023-06-05 DIAGNOSIS — Z3A37 37 weeks gestation of pregnancy: Secondary | ICD-10-CM

## 2023-06-05 DIAGNOSIS — N898 Other specified noninflammatory disorders of vagina: Secondary | ICD-10-CM | POA: Diagnosis present

## 2023-06-05 DIAGNOSIS — O09522 Supervision of elderly multigravida, second trimester: Secondary | ICD-10-CM

## 2023-06-05 DIAGNOSIS — Z3009 Encounter for other general counseling and advice on contraception: Secondary | ICD-10-CM

## 2023-06-05 DIAGNOSIS — O09299 Supervision of pregnancy with other poor reproductive or obstetric history, unspecified trimester: Secondary | ICD-10-CM

## 2023-06-05 DIAGNOSIS — O09523 Supervision of elderly multigravida, third trimester: Secondary | ICD-10-CM | POA: Diagnosis not present

## 2023-06-05 NOTE — Progress Notes (Signed)
   PRENATAL VISIT NOTE  Subjective:  Kelly Oneal is a 42 y.o. G3P2002 at [redacted]w[redacted]d being seen today for ongoing prenatal care.  She is currently monitored for the following issues for this high-risk pregnancy and has Hx of pre-eclampsia in prior pregnancy, currently pregnant; Supervision of high risk pregnancy, antepartum; AMA (advanced maternal age) multigravida 35+; and Unwanted fertility on their problem list.  Patient reports no bleeding, no cramping, no leaking, and occasional contractions.  Contractions: Irritability. Vag. Bleeding: None.  Movement: Present. Denies leaking of fluid.   The following portions of the patient's history were reviewed and updated as appropriate: allergies, current medications, past family history, past medical history, past social history, past surgical history and problem list.   Objective:   Vitals:   06/05/23 0855  BP: 96/66  Pulse: 64  Weight: 163 lb 3.2 oz (74 kg)    Fetal Status: Fetal Heart Rate (bpm): 147   Movement: Present     General:  Alert, oriented and cooperative. Patient is in no acute distress.  Skin: Skin is warm and dry. No rash noted.   Cardiovascular: Normal heart rate noted  Respiratory: Normal respiratory effort, no problems with respiration noted  Abdomen: Soft, gravid, appropriate for gestational age.  Pain/Pressure: Present     Pelvic: Cervical exam deferred        Extremities: Normal range of motion.  Edema: Trace  Mental Status: Normal mood and affect. Normal behavior. Normal judgment and thought content.   Assessment and Plan:  Pregnancy: G3P2002 at [redacted]w[redacted]d 1. Supervision of high risk pregnancy, antepartum [O09.90] FHR and BP appropriate today Continue routine prenatal care 37 weeks labs collected today - Cervicovaginal ancillary only( [place]) - Culture, beta strep (group b only)  2. Multigravida of advanced maternal age in second trimester Continue antenatal testing per MFM  3. Hx of  pre-eclampsia in prior pregnancy, currently pregnant BP within normal limits today.  4. Unwanted fertility BTL papers signed previous visit  5. [redacted] weeks gestation of pregnancy   6. Vaginal discharge during pregnancy in third trimester Wet prep collected today - Cervicovaginal ancillary only( [place])  Term labor symptoms and general obstetric precautions including but not limited to vaginal bleeding, contractions, leaking of fluid and fetal movement were reviewed in detail with the patient. Please refer to After Visit Summary for other counseling recommendations.   No follow-ups on file.  Future Appointments  Date Time Provider Department Center  06/05/2023 10:45 AM [facility] NST [facility]  06/12/2023 10:30 AM [facility] US6 [facility]  06/13/2023  8:35 AM Adam Phenix, MD [facility] None  06/19/2023  9:45 AM [facility] NST [facility]  06/21/2023  8:35 AM Lennart Pall, MD [facility] None  06/28/2023  8:35 AM Joanne Gavel, MD [facility] None  07/05/2023  8:35 AM Sundra Aland, MD [facility] None    Celedonio Savage, MD

## 2023-06-05 NOTE — Progress Notes (Signed)
Pt. Presents for rob. Pt. Complains of a lot of white vaginal discharge. No itching or burning.

## 2023-06-05 NOTE — Procedures (Signed)
Kelly Oneal [birth year] [redacted]w[redacted]d  Fetus A Non-Stress Test Interpretation for 06/05/23  Indication: Advanced Maternal Age >40 years  Fetal Heart Rate A Mode: External Baseline Rate (A): 130 bpm Variability: Moderate Accelerations: 15 x 15 Decelerations: None  Uterine Activity Mode: Toco Contraction Frequency (min): No UC's Resting Tone Palpated: Relaxed Resting Time: Adequate  Interpretation (Fetal Testing) Nonstress Test Interpretation: Reactive Overall Impression: Reassuring for gestational age

## 2023-06-06 LAB — CERVICOVAGINAL ANCILLARY ONLY
Bacterial Vaginitis (gardnerella): NEGATIVE
Candida Glabrata: NEGATIVE
Candida Vaginitis: NEGATIVE
Chlamydia: NEGATIVE
Comment: NEGATIVE
Comment: NEGATIVE
Comment: NEGATIVE
Comment: NEGATIVE
Comment: NEGATIVE
Comment: NORMAL
Neisseria Gonorrhea: NEGATIVE
Trichomonas: NEGATIVE

## 2023-06-09 ENCOUNTER — Inpatient Hospital Stay (HOSPITAL_COMMUNITY)
Admission: AD | Admit: 2023-06-09 | Discharge: 2023-06-09 | Disposition: A | Discharge status: Home / Self Care | Payer: Medicaid Other | Attending: Obstetrics & Gynecology | Admitting: Obstetrics & Gynecology

## 2023-06-09 ENCOUNTER — Encounter (HOSPITAL_COMMUNITY): Payer: Self-pay | Admitting: Obstetrics & Gynecology

## 2023-06-09 DIAGNOSIS — O471 False labor at or after 37 completed weeks of gestation: Secondary | ICD-10-CM | POA: Diagnosis not present

## 2023-06-09 DIAGNOSIS — Z3A37 37 weeks gestation of pregnancy: Secondary | ICD-10-CM | POA: Diagnosis not present

## 2023-06-09 DIAGNOSIS — O479 False labor, unspecified: Secondary | ICD-10-CM

## 2023-06-09 LAB — CULTURE, BETA STREP (GROUP B ONLY): Strep Gp B Culture: NEGATIVE

## 2023-06-09 NOTE — MAU Note (Signed)
.  Kelly Oneal is a 42 y.o. at [redacted]w[redacted]d here in MAU reporting: contractions that began at 1800. 4 min apart per pt Denies SROM, vaginal bleeding or bloody show. Endorses + fetal movement   Onset of complaint: 1800 Pain score: 6 lower abdomen and back Vitals:   06/09/23 2250  BP: 129/67  Pulse: 76  Resp: 20  Temp: 98 F (36.7 C)     FHT:150bpm Lab orders placed from triage:  mau labor  Pt escorted to room for vital signs and fetal heart rate assessment

## 2023-06-09 NOTE — MAU Note (Signed)
Pt states she does not want to stay for an hour for recheck. Dr.Leveque called with [facility] pt to be discharged to home after reactive NST.  Category1 tracing

## 2023-06-09 NOTE — MAU Provider Note (Signed)
  S: Ms. Kelly Oneal is a 42 y.o. G3P2002 at [redacted]w[redacted]d  who presents to MAU today complaining contractions q 4-6 minutes since 1800. She denies vaginal bleeding. She denies LOF. She reports normal fetal movement.    O: BP 129/67 (BP Location: Right Arm)   Pulse 76   Temp 98 F (36.7 C) (Oral)   Resp 20   LMP 09/04/2022 Comment: states she thinks it was in February  of 2024 GENERAL: Well-developed, well-nourished female in no acute distress.  HEAD: Normocephalic, atraumatic.  CHEST: Normal effort of breathing, regular heart rate ABDOMEN: Soft, nontender, gravid  Cervical exam:  Dilation: 1 Effacement (%): Thick Cervical Position: Anterior Station: -3 Presentation: Undeterminable Exam by:: Ginnie Smart RN   Fetal Monitoring: Baseline: 150 Variability: moderate Accelerations: present Decelerations: absent Contractions: Rare  A: SIUP at [redacted]w[redacted]d  False labor  P: Discharge home Labor and return precautions provided Continue prenatal care as scheduled  Wyn Forster, MD 06/09/2023 11:06 PM

## 2023-06-10 ENCOUNTER — Other Ambulatory Visit: Payer: Self-pay

## 2023-06-10 ENCOUNTER — Inpatient Hospital Stay (HOSPITAL_COMMUNITY)
Admission: AD | Admit: 2023-06-10 | Discharge: 2023-06-11 | Disposition: A | Discharge status: Home / Self Care | Payer: Medicaid Other | Attending: Obstetrics and Gynecology | Admitting: Obstetrics and Gynecology

## 2023-06-10 DIAGNOSIS — O99019 Anemia complicating pregnancy, unspecified trimester: Secondary | ICD-10-CM | POA: Insufficient documentation

## 2023-06-10 DIAGNOSIS — Z711 Person with feared health complaint in whom no diagnosis is made: Secondary | ICD-10-CM

## 2023-06-10 DIAGNOSIS — R030 Elevated blood-pressure reading, without diagnosis of hypertension: Secondary | ICD-10-CM | POA: Insufficient documentation

## 2023-06-10 DIAGNOSIS — O26893 Other specified pregnancy related conditions, third trimester: Secondary | ICD-10-CM | POA: Insufficient documentation

## 2023-06-10 DIAGNOSIS — O99013 Anemia complicating pregnancy, third trimester: Secondary | ICD-10-CM | POA: Insufficient documentation

## 2023-06-10 DIAGNOSIS — R8290 Unspecified abnormal findings in urine: Secondary | ICD-10-CM | POA: Insufficient documentation

## 2023-06-10 DIAGNOSIS — R519 Headache, unspecified: Secondary | ICD-10-CM | POA: Insufficient documentation

## 2023-06-10 DIAGNOSIS — Z3A38 38 weeks gestation of pregnancy: Secondary | ICD-10-CM | POA: Insufficient documentation

## 2023-06-10 NOTE — MAU Note (Signed)
.  Kelly Oneal is a 42 y.o. at 108w6d here in MAU reporting: having HA and blurred vision for the past 4 hours - took BP and it was in the 140s. Denies taking medication for pain. Denies epigastric pain, VB, or LOF. +FM   Onset of complaint: 2000 Pain score: 7 Vitals:   06/10/23 2356  BP: 131/62  Pulse: 78  Resp: 19  Temp: 97.8 F (36.6 C)  SpO2: 98%     FHT:160 Lab orders placed from triage:  UA

## 2023-06-11 ENCOUNTER — Encounter (HOSPITAL_COMMUNITY): Payer: Self-pay | Admitting: Obstetrics and Gynecology

## 2023-06-11 DIAGNOSIS — Z3A38 38 weeks gestation of pregnancy: Secondary | ICD-10-CM

## 2023-06-11 DIAGNOSIS — R030 Elevated blood-pressure reading, without diagnosis of hypertension: Secondary | ICD-10-CM | POA: Diagnosis not present

## 2023-06-11 DIAGNOSIS — O26893 Other specified pregnancy related conditions, third trimester: Secondary | ICD-10-CM

## 2023-06-11 DIAGNOSIS — O99013 Anemia complicating pregnancy, third trimester: Secondary | ICD-10-CM | POA: Diagnosis not present

## 2023-06-11 DIAGNOSIS — R519 Headache, unspecified: Secondary | ICD-10-CM

## 2023-06-11 DIAGNOSIS — O99019 Anemia complicating pregnancy, unspecified trimester: Secondary | ICD-10-CM | POA: Insufficient documentation

## 2023-06-11 DIAGNOSIS — R8290 Unspecified abnormal findings in urine: Secondary | ICD-10-CM | POA: Diagnosis not present

## 2023-06-11 LAB — COMPREHENSIVE METABOLIC PANEL
ALT: 11 U/L (ref 0–44)
AST: 18 U/L (ref 15–41)
Albumin: 2.3 g/dL — ABNORMAL LOW (ref 3.5–5.0)
Alkaline Phosphatase: 175 U/L — ABNORMAL HIGH (ref 38–126)
Anion gap: 6 (ref 5–15)
BUN: 10 mg/dL (ref 6–20)
CO2: 21 mmol/L — ABNORMAL LOW (ref 22–32)
Calcium: 9.1 mg/dL (ref 8.9–10.3)
Chloride: 107 mmol/L (ref 98–111)
Creatinine, Ser: 0.52 mg/dL (ref 0.44–1.00)
GFR, Estimated: >60 mL/min (ref >60)
Glucose, Bld: 104 mg/dL — ABNORMAL HIGH (ref 70–99)
Potassium: 3.7 mmol/L (ref 3.5–5.1)
Sodium: 134 mmol/L — ABNORMAL LOW (ref 135–145)
Total Bilirubin: 0.2 mg/dL (ref <1.2)
Total Protein: 5.3 g/dL — ABNORMAL LOW (ref 6.5–8.1)

## 2023-06-11 LAB — URINALYSIS, ROUTINE W REFLEX MICROSCOPIC
Bilirubin Urine: NEGATIVE
Glucose, UA: NEGATIVE mg/dL
Hgb urine dipstick: NEGATIVE
Ketones, ur: NEGATIVE mg/dL
Nitrite: NEGATIVE
Protein, ur: NEGATIVE mg/dL
Specific Gravity, Urine: 1.006 (ref 1.005–1.030)
pH: 6 (ref 5.0–8.0)

## 2023-06-11 LAB — CBC WITH DIFFERENTIAL/PLATELET
Abs Immature Granulocytes: 0.06 10*3/uL (ref 0.00–0.07)
Basophils Absolute: 0 10*3/uL (ref 0.0–0.1)
Basophils Relative: 0 %
Eosinophils Absolute: 0.1 10*3/uL (ref 0.0–0.5)
Eosinophils Relative: 1 %
HCT: 29.3 % — ABNORMAL LOW (ref 36.0–46.0)
Hemoglobin: 8.9 g/dL — ABNORMAL LOW (ref 12.0–15.0)
Immature Granulocytes: 1 %
Lymphocytes Relative: 26 %
Lymphs Abs: 2.1 10*3/uL (ref 0.7–4.0)
MCH: 23.1 pg — ABNORMAL LOW (ref 26.0–34.0)
MCHC: 30.4 g/dL (ref 30.0–36.0)
MCV: 75.9 fL — ABNORMAL LOW (ref 80.0–100.0)
Monocytes Absolute: 0.8 10*3/uL (ref 0.1–1.0)
Monocytes Relative: 10 %
Neutro Abs: 5.1 10*3/uL (ref 1.7–7.7)
Neutrophils Relative %: 62 %
Platelets: 187 10*3/uL (ref 150–400)
RBC: 3.86 MIL/uL — ABNORMAL LOW (ref 3.87–5.11)
RDW: 14.8 % (ref 11.5–15.5)
WBC: 8.3 10*3/uL (ref 4.0–10.5)
nRBC: 0 % (ref 0.0–0.2)

## 2023-06-11 LAB — PROTEIN / CREATININE RATIO, URINE
Creatinine, Urine: 33 mg/dL
Total Protein, Urine: <6 mg/dL

## 2023-06-11 MED ORDER — ACETAMINOPHEN-CAFFEINE 500-65 MG PO TABS
2.0000 | ORAL_TABLET | Freq: Once | ORAL | Status: AC
  Administered 2023-06-11: 2 via ORAL
  Filled 2023-06-11: qty 2

## 2023-06-11 NOTE — MAU Provider Note (Addendum)
History     CSN: [id number]  Arrival date and time: 06/10/23 2346   Event Date/Time   First Provider Initiated Contact with Patient 06/11/23 0038      Chief Complaint  Patient presents with   Headache   Hypertension   Kelly Oneal , a  42 y.o. G3P2002 at [redacted]w[redacted]d presents to MAU with complaints of headache and high blood pressure at home. Patient reports an on-going headache for the last 5 hours. She reports eating and drinking 2 cups of water without relief. Reports that it right across the front of her head and states she was having some blurred vision on the way to MAU but none at this time. She states that after her headache did not go away she took her BP and was 145/86 at home and she came to MAU. She denies vaginal bleeding, leaking of fluid and contractions. She endorses positive fetal movement.          OB History     Gravida  3   Para  2   Term  2   Preterm      AB      Living  2      SAB      IAB      Ectopic      Multiple      Live Births  2           Past Medical History:  Diagnosis Date   Anemia    Cardiomegaly    Dyspnea    Hx of pre-eclampsia in prior pregnancy, currently pregnant    Kidney stones    Pulmonary hypertension (HCC)     Past Surgical History:  Procedure Laterality Date   NO PAST SURGERIES      Family History  Problem Relation Age of Onset   Hypertension Maternal Grandmother    Hypertension Maternal Grandfather    Asthma Neg Hx    Cancer Neg Hx    Diabetes Neg Hx    Heart disease Neg Hx     Social History   Tobacco Use   Smoking status: Never    Passive exposure: Never   Smokeless tobacco: Never  Vaping Use   Vaping status: Never Used  Substance Use Topics   Alcohol use: No   Drug use: No    Allergies:  Allergies  Allergen Reactions   Food Color Red Diarrhea   Penicillins Rash    Medications Prior to Admission  Medication Sig Dispense Refill Last Dose   aspirin EC 81 MG  tablet Take 1 tablet (81 mg total) by mouth daily. Swallow whole. 30 tablet 12 06/10/2023   Prenatal MV & Min w/FA-DHA (PRENATAL ADULT GUMMY/DHA/FA) 0.4-25 MG CHEW Chew 1 tablet by mouth daily. 180 tablet 0 06/10/2023   Blood Pressure Monitoring (BLOOD PRESSURE KIT) DEVI 1 kit by Does not apply route once a week. 1 each 0    Elastic Bandages & Supports (WRIST BRACE/RIGHT SMALL) MISC 1 Device by Does not apply route daily. (Patient not taking: Reported on 06/05/2023) 1 each 0    Misc. Devices (GOJJI WEIGHT SCALE) MISC 1 Device by Does not apply route every 30 (thirty) days. 1 each 0     Review of Systems  Constitutional:  Negative for chills, fatigue and fever.  Eyes:  Negative for pain and visual disturbance.  Respiratory:  Negative for apnea, shortness of breath and wheezing.   Cardiovascular:  Negative for chest pain and palpitations.  Gastrointestinal:  Negative for abdominal pain, constipation, diarrhea, nausea and vomiting.  Genitourinary:  Negative for difficulty urinating, dysuria, pelvic pain, vaginal bleeding, vaginal discharge and vaginal pain.  Musculoskeletal:  Negative for back pain.  Neurological:  Positive for headaches. Negative for dizziness, seizures, weakness and light-headedness.  Psychiatric/Behavioral:  Negative for suicidal ideas.    Physical Exam   Blood pressure 129/69, pulse 74, temperature 97.8 F (36.6 C), temperature source Oral, resp. rate 19, height 4\' 11"  (1.499 m), weight 75.7 kg, last menstrual period 09/04/2022, SpO2 99%.  Physical Exam Vitals and nursing note reviewed.  Constitutional:      General: She is not in acute distress.    Appearance: Normal appearance.  HENT:     Head: Normocephalic.  Pulmonary:     Effort: Pulmonary effort is normal.  Abdominal:     Palpations: Abdomen is soft.     Tenderness: There is no abdominal tenderness.     Comments: Gravid uterus   Musculoskeletal:        General: No swelling.     Cervical back: Normal range  of motion.  Skin:    General: Skin is warm and dry.  Neurological:     Mental Status: She is alert and oriented to person, place, and time.     GCS: GCS eye subscore is 4. GCS verbal subscore is 5. GCS motor subscore is 6.     Deep Tendon Reflexes: Reflexes normal.  Psychiatric:        Mood and Affect: Mood normal.    FHT: 135 bpm with moderate variability 15x15 accels no decels.  Toco: irregular contractions. Patient unaware.   MAU Course  Procedures Orders Placed This Encounter  Procedures   Urinalysis, Routine w reflex microscopic -Urine, Clean Catch   CBC with Differential/Platelet   Comprehensive metabolic panel   Protein / creatinine ratio, urine   Meds ordered this encounter  Medications   acetaminophen-caffeine (EXCEDRIN TENSION HEADACHE) 500-65 MG per tablet 2 tablet   Results for orders placed or performed during the hospital encounter of 06/10/23 (from the past 24 hour(s))  Urinalysis, Routine w reflex microscopic -Urine, Clean Catch     Status: Abnormal   Collection Time: 06/11/23 12:06 AM  Result Value Ref Range   Color, Urine YELLOW YELLOW   APPearance HAZY (A) CLEAR   Specific Gravity, Urine 1.006 1.005 - 1.030   pH 6.0 5.0 - 8.0   Glucose, UA NEGATIVE NEGATIVE mg/dL   Hgb urine dipstick NEGATIVE NEGATIVE   Bilirubin Urine NEGATIVE NEGATIVE   Ketones, ur NEGATIVE NEGATIVE mg/dL   Protein, ur NEGATIVE NEGATIVE mg/dL   Nitrite NEGATIVE NEGATIVE   Leukocytes,Ua MODERATE (A) NEGATIVE   RBC / HPF 0-5 0 - 5 RBC/hpf   WBC, UA 6-10 0 - 5 WBC/hpf   Bacteria, UA RARE (A) NONE SEEN   Squamous Epithelial / HPF 6-10 0 - 5 /HPF  Protein / creatinine ratio, urine     Status: None   Collection Time: 06/11/23 12:06 AM  Result Value Ref Range   Creatinine, Urine 33 mg/dL   Total Protein, Urine <6 mg/dL   Protein Creatinine Ratio        0.00 - 0.15 mg/mg[Cre]  CBC with Differential/Platelet     Status: Abnormal   Collection Time: 06/11/23 12:40 AM  Result Value Ref  Range   WBC 8.3 4.0 - 10.5 K/uL   RBC 3.86 (L) 3.87 - 5.11 MIL/uL   Hemoglobin 8.9 (L) 12.0 - 15.0 g/dL   HCT  29.3 (L) 36.0 - 46.0 %   MCV 75.9 (L) 80.0 - 100.0 fL   MCH 23.1 (L) 26.0 - 34.0 pg   MCHC 30.4 30.0 - 36.0 g/dL   RDW 96.2 95.2 - 84.1 %   Platelets 187 150 - 400 K/uL   nRBC 0.0 0.0 - 0.2 %   Neutrophils Relative % 62 %   Neutro Abs 5.1 1.7 - 7.7 K/uL   Lymphocytes Relative 26 %   Lymphs Abs 2.1 0.7 - 4.0 K/uL   Monocytes Relative 10 %   Monocytes Absolute 0.8 0.1 - 1.0 K/uL   Eosinophils Relative 1 %   Eosinophils Absolute 0.1 0.0 - 0.5 K/uL   Basophils Relative 0 %   Basophils Absolute 0.0 0.0 - 0.1 K/uL   Immature Granulocytes 1 %   Abs Immature Granulocytes 0.06 0.00 - 0.07 K/uL  Comprehensive metabolic panel     Status: Abnormal   Collection Time: 06/11/23 12:40 AM  Result Value Ref Range   Sodium 134 (L) 135 - 145 mmol/L   Potassium 3.7 3.5 - 5.1 mmol/L   Chloride 107 98 - 111 mmol/L   CO2 21 (L) 22 - 32 mmol/L   Glucose, Bld 104 (H) 70 - 99 mg/dL   BUN 10 6 - 20 mg/dL   Creatinine, Ser 3.24 0.44 - 1.00 mg/dL   Calcium 9.1 8.9 - 40.1 mg/dL   Total Protein 5.3 (L) 6.5 - 8.1 g/dL   Albumin 2.3 (L) 3.5 - 5.0 g/dL   AST 18 15 - 41 U/L   ALT 11 0 - 44 U/L   Alkaline Phosphatase 175 (H) 38 - 126 U/L   Total Bilirubin 0.2 <1.2 mg/dL   GFR, Estimated >02 >72 mL/min   Anion gap 6 5 - 15   Patient Vitals for the past 24 hrs:  BP Temp Temp src Pulse Resp SpO2 Height Weight  06/11/23 0100 126/73 -- -- 71 -- 99 % -- --  06/11/23 0055 -- -- -- -- -- 99 % -- --  06/11/23 0050 126/74 -- -- 69 -- 99 % -- --  06/11/23 0045 -- -- -- -- -- 99 % -- --  06/11/23 0035 -- -- -- -- -- 99 % -- --  06/11/23 0030 -- -- -- -- -- 98 % -- --  06/11/23 0025 -- -- -- -- -- 98 % -- --  06/11/23 0020 -- -- -- -- -- 98 % -- --  06/11/23 0016 129/69 -- -- 74 -- 99 % -- --  06/10/23 2356 131/62 97.8 F (36.6 C) Oral 78 19 98 % 4\' 11"  (1.499 m) 75.7 kg    MDM - PO Excedrin given  for headache   - PreE labs normal, Normal BPs in MAU, low suspicion for PreE.  - Headache improved with PO Excedrin.   Assessment and Plan   1. Pregnancy headache in third trimester   2. [redacted] weeks gestation of pregnancy   3. Physically well but worried    - Reviewed worsening signs and return precautions.  - Recommended OTC options for headache relief that are safe in pregnancy,  - Anemia in pregnancy reviewed. Recommended PO iron supplements.  - FHT appropriate for gestational age at time of discharge.  - Patient discharged home in stable condition and may return to MAU as needed.   Claudette Head, MSN CNM  06/11/2023, 12:38 AM

## 2023-06-12 ENCOUNTER — Other Ambulatory Visit: Payer: Self-pay

## 2023-06-12 ENCOUNTER — Ambulatory Visit: Payer: Medicaid Other | Attending: Maternal & Fetal Medicine

## 2023-06-12 VITALS — BP 112/59

## 2023-06-12 DIAGNOSIS — O09523 Supervision of elderly multigravida, third trimester: Secondary | ICD-10-CM | POA: Diagnosis present

## 2023-06-12 DIAGNOSIS — O0990 Supervision of high risk pregnancy, unspecified, unspecified trimester: Secondary | ICD-10-CM | POA: Insufficient documentation

## 2023-06-12 DIAGNOSIS — Z3A38 38 weeks gestation of pregnancy: Secondary | ICD-10-CM | POA: Diagnosis not present

## 2023-06-12 DIAGNOSIS — O09293 Supervision of pregnancy with other poor reproductive or obstetric history, third trimester: Secondary | ICD-10-CM

## 2023-06-12 LAB — CULTURE, OB URINE

## 2023-06-13 ENCOUNTER — Telehealth (HOSPITAL_COMMUNITY): Payer: Self-pay | Admitting: *Deleted

## 2023-06-13 ENCOUNTER — Ambulatory Visit: Payer: Medicaid Other | Admitting: Obstetrics & Gynecology

## 2023-06-13 ENCOUNTER — Encounter (HOSPITAL_COMMUNITY): Payer: Self-pay | Admitting: *Deleted

## 2023-06-13 ENCOUNTER — Other Ambulatory Visit: Payer: Self-pay | Admitting: Advanced Practice Midwife

## 2023-06-13 VITALS — BP 116/75 | HR 65 | Wt 165.3 lb

## 2023-06-13 DIAGNOSIS — O09523 Supervision of elderly multigravida, third trimester: Secondary | ICD-10-CM

## 2023-06-13 DIAGNOSIS — Z3009 Encounter for other general counseling and advice on contraception: Secondary | ICD-10-CM

## 2023-06-13 DIAGNOSIS — O99013 Anemia complicating pregnancy, third trimester: Secondary | ICD-10-CM

## 2023-06-13 DIAGNOSIS — Z3A38 38 weeks gestation of pregnancy: Secondary | ICD-10-CM

## 2023-06-13 DIAGNOSIS — O09522 Supervision of elderly multigravida, second trimester: Secondary | ICD-10-CM

## 2023-06-13 DIAGNOSIS — O0990 Supervision of high risk pregnancy, unspecified, unspecified trimester: Secondary | ICD-10-CM

## 2023-06-13 NOTE — Progress Notes (Unsigned)
Pt to hospital 06/12/2023. UA done. Recommended recollection. Neg GBS but multiple species present.

## 2023-06-13 NOTE — Telephone Encounter (Signed)
Preadmission screen  

## 2023-06-14 NOTE — Progress Notes (Signed)
   PRENATAL VISIT NOTE  Subjective:  Kelly Oneal is a 42 y.o. G3P2002 at [redacted]w[redacted]d being seen today for ongoing prenatal care.  She is currently monitored for the following issues for this high-risk pregnancy and has Hx of pre-eclampsia in prior pregnancy, currently pregnant; Supervision of high risk pregnancy, antepartum; AMA (advanced maternal age) multigravida 35+; Unwanted fertility; and Anemia in pregnancy on their problem list.  Patient reports no complaints.  Contractions: Irregular. Vag. Bleeding: None.  Movement: Present. Denies leaking of fluid.   The following portions of the patient's history were reviewed and updated as appropriate: allergies, current medications, past family history, past medical history, past social history, past surgical history and problem list.   Objective:   Vitals:   06/13/23 0858  BP: 116/75  Pulse: 65  Weight: 165 lb 4.8 oz (75 kg)    Fetal Status:     Movement: Present     General:  Alert, oriented and cooperative. Patient is in no acute distress.  Skin: Skin is warm and dry. No rash noted.   Cardiovascular: Normal heart rate noted  Respiratory: Normal respiratory effort, no problems with respiration noted  Abdomen: Soft, gravid, appropriate for gestational age.  Pain/Pressure: Absent     Pelvic: Cervical exam deferred        Extremities: Normal range of motion.  Edema: None  Mental Status: Normal mood and affect. Normal behavior. Normal judgment and thought content.   Assessment and Plan:  Pregnancy: G3P2002 at [redacted]w[redacted]d 1. Supervision of high risk pregnancy, antepartum IOL 39 weeks  2. [redacted] weeks gestation of pregnancy   3. Multigravida of advanced maternal age in second trimester   4. Unwanted fertility   5. Anemia during pregnancy in third trimester   Term labor symptoms and general obstetric precautions including but not limited to vaginal bleeding, contractions, leaking of fluid and fetal movement were reviewed in  detail with the patient. Please refer to After Visit Summary for other counseling recommendations.   No follow-ups on file.  Future Appointments  Date Time Provider Department Center  06/18/2023  6:30 AM [facility] SCHED ROOM [facility] None  06/19/2023  9:45 AM [facility] NST [facility]  07/31/2023 10:15 AM Leftwich-Kirby, Wilmer Floor, CNM [facility] None    Scheryl Darter, MD

## 2023-06-18 ENCOUNTER — Inpatient Hospital Stay (HOSPITAL_COMMUNITY)
Admission: RE | Admit: 2023-06-18 | Discharge: 2023-06-21 | DRG: 798 | Disposition: A | Discharge status: Home / Self Care | Payer: Medicaid Other | Attending: Obstetrics and Gynecology | Admitting: Obstetrics and Gynecology

## 2023-06-18 ENCOUNTER — Other Ambulatory Visit: Payer: Self-pay

## 2023-06-18 ENCOUNTER — Inpatient Hospital Stay (HOSPITAL_COMMUNITY): Payer: Medicaid Other

## 2023-06-18 ENCOUNTER — Encounter (HOSPITAL_COMMUNITY): Payer: Self-pay | Admitting: Family Medicine

## 2023-06-18 DIAGNOSIS — Z3A39 39 weeks gestation of pregnancy: Secondary | ICD-10-CM | POA: Diagnosis not present

## 2023-06-18 DIAGNOSIS — I517 Cardiomegaly: Secondary | ICD-10-CM | POA: Diagnosis not present

## 2023-06-18 DIAGNOSIS — Z8249 Family history of ischemic heart disease and other diseases of the circulatory system: Secondary | ICD-10-CM | POA: Diagnosis not present

## 2023-06-18 DIAGNOSIS — O9962 Diseases of the digestive system complicating childbirth: Secondary | ICD-10-CM | POA: Diagnosis present

## 2023-06-18 DIAGNOSIS — Z302 Encounter for sterilization: Secondary | ICD-10-CM | POA: Diagnosis not present

## 2023-06-18 DIAGNOSIS — Z880 Allergy status to penicillin: Secondary | ICD-10-CM

## 2023-06-18 DIAGNOSIS — O09523 Supervision of elderly multigravida, third trimester: Secondary | ICD-10-CM | POA: Diagnosis not present

## 2023-06-18 DIAGNOSIS — Z8679 Personal history of other diseases of the circulatory system: Secondary | ICD-10-CM

## 2023-06-18 DIAGNOSIS — O9902 Anemia complicating childbirth: Principal | ICD-10-CM | POA: Diagnosis present

## 2023-06-18 DIAGNOSIS — O26893 Other specified pregnancy related conditions, third trimester: Secondary | ICD-10-CM | POA: Diagnosis present

## 2023-06-18 DIAGNOSIS — Z7982 Long term (current) use of aspirin: Secondary | ICD-10-CM | POA: Diagnosis not present

## 2023-06-18 DIAGNOSIS — O99019 Anemia complicating pregnancy, unspecified trimester: Secondary | ICD-10-CM | POA: Diagnosis present

## 2023-06-18 DIAGNOSIS — Z3009 Encounter for other general counseling and advice on contraception: Secondary | ICD-10-CM | POA: Diagnosis present

## 2023-06-18 DIAGNOSIS — K429 Umbilical hernia without obstruction or gangrene: Secondary | ICD-10-CM | POA: Diagnosis present

## 2023-06-18 DIAGNOSIS — O09529 Supervision of elderly multigravida, unspecified trimester: Secondary | ICD-10-CM

## 2023-06-18 DIAGNOSIS — I272 Pulmonary hypertension, unspecified: Secondary | ICD-10-CM | POA: Diagnosis not present

## 2023-06-18 LAB — CBC
HCT: 32.1 % — ABNORMAL LOW (ref 36.0–46.0)
Hemoglobin: 9.6 g/dL — ABNORMAL LOW (ref 12.0–15.0)
MCH: 22.9 pg — ABNORMAL LOW (ref 26.0–34.0)
MCHC: 29.9 g/dL — ABNORMAL LOW (ref 30.0–36.0)
MCV: 76.4 fL — ABNORMAL LOW (ref 80.0–100.0)
Platelets: 185 10*3/uL (ref 150–400)
RBC: 4.2 MIL/uL (ref 3.87–5.11)
RDW: 15.2 % (ref 11.5–15.5)
WBC: 6.9 10*3/uL (ref 4.0–10.5)
nRBC: 0 % (ref 0.0–0.2)

## 2023-06-18 LAB — COMPREHENSIVE METABOLIC PANEL
ALT: 13 U/L (ref 0–44)
AST: 22 U/L (ref 15–41)
Albumin: 2.4 g/dL — ABNORMAL LOW (ref 3.5–5.0)
Alkaline Phosphatase: 176 U/L — ABNORMAL HIGH (ref 38–126)
Anion gap: 9 (ref 5–15)
BUN: 10 mg/dL (ref 6–20)
CO2: 21 mmol/L — ABNORMAL LOW (ref 22–32)
Calcium: 8.9 mg/dL (ref 8.9–10.3)
Chloride: 106 mmol/L (ref 98–111)
Creatinine, Ser: 0.47 mg/dL (ref 0.44–1.00)
GFR, Estimated: >60 mL/min (ref >60)
Glucose, Bld: 79 mg/dL (ref 70–99)
Potassium: 4 mmol/L (ref 3.5–5.1)
Sodium: 136 mmol/L (ref 135–145)
Total Bilirubin: 0.4 mg/dL (ref <1.2)
Total Protein: 5.5 g/dL — ABNORMAL LOW (ref 6.5–8.1)

## 2023-06-18 LAB — TYPE AND SCREEN
ABO/RH(D): O POS
Antibody Screen: NEGATIVE

## 2023-06-18 LAB — RPR: RPR Ser Ql: NONREACTIVE

## 2023-06-18 MED ORDER — DIPHENHYDRAMINE HCL 50 MG/ML IJ SOLN: 12.5000 mg | INTRAMUSCULAR | Status: DC | PRN

## 2023-06-18 MED ORDER — FENTANYL-BUPIVACAINE-NACL 0.5-0.125-0.9 MG/250ML-% EP SOLN
EPIDURAL | Status: DC | PRN
  Administered 2023-06-18: 12 mL/h via EPIDURAL

## 2023-06-18 MED ORDER — EPHEDRINE 5 MG/ML INJ: 10.0000 mg | INTRAVENOUS | Status: DC | PRN

## 2023-06-18 MED ORDER — PHENYLEPHRINE 80 MCG/ML (10ML) SYRINGE FOR IV PUSH (FOR BLOOD PRESSURE SUPPORT)
80.0000 ug | PREFILLED_SYRINGE | INTRAVENOUS | Status: DC | PRN
  Filled 2023-06-18: qty 10

## 2023-06-18 MED ORDER — SOD CITRATE-CITRIC ACID 500-334 MG/5ML PO SOLN: 30.0000 mL | ORAL | Status: DC | PRN

## 2023-06-18 MED ORDER — MISOPROSTOL 50MCG HALF TABLET
50.0000 ug | ORAL_TABLET | Freq: Once | ORAL | Status: AC
  Administered 2023-06-18: 50 ug via BUCCAL
  Filled 2023-06-18: qty 1

## 2023-06-18 MED ORDER — LACTATED RINGERS IV SOLN: 500.0000 mL | INTRAVENOUS | Status: DC | PRN

## 2023-06-18 MED ORDER — OXYTOCIN BOLUS FROM INFUSION
333.0000 mL | Freq: Once | INTRAVENOUS | Status: AC
  Administered 2023-06-19: 333 mL via INTRAVENOUS

## 2023-06-18 MED ORDER — ACETAMINOPHEN 325 MG PO TABS: 650.0000 mg | ORAL_TABLET | ORAL | Status: DC | PRN

## 2023-06-18 MED ORDER — LIDOCAINE HCL (PF) 1 % IJ SOLN
INTRAMUSCULAR | Status: DC | PRN
  Administered 2023-06-18 (×2): 5 mL via EPIDURAL

## 2023-06-18 MED ORDER — LIDOCAINE HCL (PF) 1 % IJ SOLN: 30.0000 mL | INTRAMUSCULAR | Status: DC | PRN

## 2023-06-18 MED ORDER — OXYTOCIN-SODIUM CHLORIDE 30-0.9 UT/500ML-% IV SOLN
2.5000 [IU]/h | INTRAVENOUS | Status: DC
  Administered 2023-06-19: 2.5 [IU]/h via INTRAVENOUS

## 2023-06-18 MED ORDER — FENTANYL CITRATE (PF) 100 MCG/2ML IJ SOLN
100.0000 ug | INTRAMUSCULAR | Status: DC | PRN
  Administered 2023-06-18 (×3): 100 ug via INTRAVENOUS
  Filled 2023-06-18 (×3): qty 2

## 2023-06-18 MED ORDER — ONDANSETRON HCL 4 MG/2ML IJ SOLN: 4.0000 mg | Freq: Four times a day (QID) | INTRAMUSCULAR | Status: DC | PRN

## 2023-06-18 MED ORDER — PHENYLEPHRINE 80 MCG/ML (10ML) SYRINGE FOR IV PUSH (FOR BLOOD PRESSURE SUPPORT)
80.0000 ug | PREFILLED_SYRINGE | INTRAVENOUS | Status: DC | PRN
  Administered 2023-06-18 (×2): 80 ug via INTRAVENOUS

## 2023-06-18 MED ORDER — LACTATED RINGERS IV SOLN: INTRAVENOUS | Status: DC

## 2023-06-18 MED ORDER — FENTANYL-BUPIVACAINE-NACL 0.5-0.125-0.9 MG/250ML-% EP SOLN
12.0000 mL/h | EPIDURAL | Status: DC | PRN
  Filled 2023-06-18: qty 250

## 2023-06-18 MED ORDER — LACTATED RINGERS IV SOLN
500.0000 mL | Freq: Once | INTRAVENOUS | Status: AC
  Administered 2023-06-18: 500 mL via INTRAVENOUS

## 2023-06-18 MED ORDER — OXYTOCIN-SODIUM CHLORIDE 30-0.9 UT/500ML-% IV SOLN
1.0000 m[IU]/min | INTRAVENOUS | Status: DC
  Administered 2023-06-18: 2 m[IU]/min via INTRAVENOUS
  Filled 2023-06-18: qty 500

## 2023-06-18 MED ORDER — TERBUTALINE SULFATE 1 MG/ML IJ SOLN: 0.2500 mg | Freq: Once | INTRAMUSCULAR | Status: DC | PRN

## 2023-06-18 NOTE — Anesthesia Procedure Notes (Signed)
Epidural Patient location during procedure: OB Start time: 06/18/2023 7:42 PM End time: 06/18/2023 7:47 PM  Staffing Anesthesiologist: Powell Nation, MD Performed: anesthesiologist   Preanesthetic Checklist Completed: patient identified, IV checked, risks and benefits discussed, monitors and equipment checked, pre-op evaluation and timeout performed  Epidural Patient position: sitting Prep: DuraPrep Patient monitoring: heart rate, cardiac monitor, continuous pulse ox and blood pressure Approach: midline Location: L3-L4 Injection technique: LOR air  Needle:  Needle type: Tuohy  Needle gauge: 17 G Needle length: 9 cm Needle insertion depth: 6 cm Catheter type: closed end flexible Catheter size: 19 Gauge Catheter at skin depth: 11 cm Test dose: negative  Assessment Sensory level: T8  Additional Notes Patient identified. Risks/Benefits/Options discussed with patient including but not limited to bleeding, infection, nerve damage, paralysis, failed block, incomplete pain control, headache, blood pressure changes, nausea, vomiting, reactions to medication both or allergic, itching and postpartum back pain. Confirmed with bedside nurse the patient's most recent platelet count. Confirmed with patient that they are not currently taking any anticoagulation, have any bleeding history or any family history of bleeding disorders. Patient expressed understanding and wished to proceed. All questions were answered. Sterile technique was used throughout the entire procedure. Please see nursing notes for vital signs. Test dose was given through epidural catheter and negative prior to continuing to dose epidural or start infusion. Warning signs of high block given to the patient including shortness of breath, tingling/numbness in hands, complete motor block, or any concerning symptoms with instructions to call for help. Patient was given instructions on fall risk and not to get out of bed. All questions  and concerns addressed with instructions to call with any issues or inadequate analgesia.  Reason for block:procedure for pain

## 2023-06-18 NOTE — Progress Notes (Signed)
Saw patient, discussed plan of care, advised to let team know if she feels increased pressure. Epidural in place and patient comfortable at this time. Strip cat 1. Will continue to titrate pitocin. Esau Grew, MS3

## 2023-06-18 NOTE — Progress Notes (Signed)
Labor Progress Note Kelly Oneal is a 42 y.o. G3P2002 at [redacted]w[redacted]d presented for IOL for AMA S: coping well with IV fentanyl for pain, desires to hold off on epidural as long as able.   O:  BP 118/64   Pulse 69   Temp 97.8 F (36.6 C) (Oral)   Resp 14   LMP 09/04/2022 Comment: states she thinks it was in February  of 2024 EFM: 130/moderate variability/accels present/no decels  CVE: Dilation: 1.5 Effacement (%): 80 Cervical Position: Middle Station: -3 Presentation: Vertex Exam by:: Dr. Leanora Cover   A&P: 42 y.o. [id number] [redacted]w[redacted]d admitted for IOL #Labor: Progressing slowly. On 6u pitocin. SROM 1344. Continue to titrate pitocin. Abdominal binder on to facilitate vertex presentation #Pain: IV fentanyl #FWB: Cat I #GBS negative  History of PEC in prior pregnancy Anemia of pregnancy, Hgb 8.9 on 12/2 - 9.6 on admission  Wyn Forster, MD 6:30 PM

## 2023-06-18 NOTE — Progress Notes (Signed)
Will give 5 days of Lasix postpartum given history of postpartum cardiomegaly following first delivery.   Wyn Forster, MD FMOB Fellow, Faculty practice [facility], Center for [facility]

## 2023-06-18 NOTE — Anesthesia Preprocedure Evaluation (Addendum)
Anesthesia Evaluation  Patient identified by MRN, date of birth, ID band Patient awake    Reviewed: Allergy & Precautions, H&P , NPO status , Patient's Chart, lab work & pertinent test results  History of Anesthesia Complications Negative for: history of anesthetic complications  Airway Mallampati: II  TM Distance: >3 FB Neck ROM: Full    Dental no notable dental hx.    Pulmonary neg pulmonary ROS, neg shortness of breath   Pulmonary exam normal breath sounds clear to auscultation       Cardiovascular pulmonary hypertensionNormal cardiovascular exam Rhythm:Regular Rate:Normal     Neuro/Psych negative neurological ROS  negative psych ROS   GI/Hepatic negative GI ROS, Neg liver ROS,,,  Endo/Other  negative endocrine ROS    Renal/GU negative Renal ROS  negative genitourinary   Musculoskeletal negative musculoskeletal ROS (+)    Abdominal   Peds negative pediatric ROS (+)  Hematology  (+) anemia   Anesthesia Other Findings Hx of pre-E in prior pregnancy Was told she had cardiomegaly and pHTN following delivery in 2012. No TTE on file. No evidence of cardiac records in care everywhere.  Patient reports she is assymptomatic. Denies any cardiac sx, edema, SOB.   Reproductive/Obstetrics (+) Pregnancy                             Anesthesia Physical Anesthesia Plan  ASA: 2  Anesthesia Plan: Epidural   Post-op Pain Management: Epidural*   Induction:   PONV Risk Score and Plan: Treatment may vary due to age or medical condition  Airway Management Planned: Natural Airway  Additional Equipment:   Intra-op Plan:   Post-operative Plan:   Informed Consent: I have reviewed the patients History and Physical, chart, labs and discussed the procedure including the risks, benefits and alternatives for the proposed anesthesia with the patient or authorized representative who has indicated  his/her understanding and acceptance.     Dental advisory given  Plan Discussed with: CRNA  Anesthesia Plan Comments: (Patient identified. Risks, benefits, options discussed with patient including but not limited to bleeding, infection, nerve damage, paralysis, failed block, incomplete pain control, headache, blood pressure changes, nausea, vomiting, reactions to medication, itching, and post partum back pain. Confirmed with bedside nurse the patient's most recent platelet count. Confirmed with the patient that they are not taking any anticoagulation, have any bleeding history or any family history of bleeding disorders. Patient expressed understanding and wishes to proceed. All questions were answered. )       Anesthesia Quick Evaluation

## 2023-06-18 NOTE — H&P (Cosign Needed Addendum)
OBSTETRIC ADMISSION HISTORY AND PHYSICAL  Kelly Oneal is a 42 y.o. female G3P2002 with IUP at [redacted]w[redacted]d by 12 week [place] presenting for IOL for AMA. She reports +FMs, No LOF, no VB, no blurry vision, headaches or peripheral edema, and RUQ pain.  She plans on breast feeding. She request ppBTL for birth control. She received her prenatal care at  [facility]    Dating: By 12 week [place] --->  Estimated Date of Delivery: 06/25/23  Sono:    @[redacted]w[redacted]d , CWD, normal anatomy, cephalic presentation, 3076g, 96% EFW   Prenatal History/Complications:  History of PEC in prior pregnancy Anemia of pregnancy, Hgb 8.9 on 12/2  Past Medical History: Past Medical History:  Diagnosis Date   Anemia    Cardiomegaly    Dyspnea    Family history of adverse reaction to anesthesia    mom heart stopped with anesthesia not know type   Hx of pre-eclampsia in prior pregnancy, currently pregnant    Kidney stones    Pulmonary hypertension (HCC)     Past Surgical History: Past Surgical History:  Procedure Laterality Date   NO PAST SURGERIES      Obstetrical History: OB History     Gravida  3   Para  2   Term  2   Preterm      AB      Living  2      SAB      IAB      Ectopic      Multiple      Live Births  2           Social History Social History   Socioeconomic History   Marital status: Married    Spouse name: Not on file   Number of children: Not on file   Years of education: Not on file   Highest education level: Not on file  Occupational History   Not on file  Tobacco Use   Smoking status: Never    Passive exposure: Never   Smokeless tobacco: Never  Vaping Use   Vaping status: Never Used  Substance and Sexual Activity   Alcohol use: No   Drug use: No   Sexual activity: Not Currently    Partners: Male    Birth control/protection: None  Other Topics Concern   Not on file  Social History Narrative   ** Merged History Encounter **       Social Determinants  of Health   Financial Resource Strain: Not on file  Food Insecurity: Not on file  Transportation Needs: Not on file  Physical Activity: Not on file  Stress: Not on file  Social Connections: Not on file    Family History: Family History  Problem Relation Age of Onset   Hypertension Maternal Grandmother    Hypertension Maternal Grandfather    Asthma Neg Hx    Cancer Neg Hx    Diabetes Neg Hx    Heart disease Neg Hx     Allergies: Allergies  Allergen Reactions   Food Color Red Diarrhea   Penicillins Rash    Medications Prior to Admission  Medication Sig Dispense Refill Last Dose   aspirin EC 81 MG tablet Take 1 tablet (81 mg total) by mouth daily. Swallow whole. 30 tablet 12    Blood Pressure Monitoring (BLOOD PRESSURE KIT) DEVI 1 kit by Does not apply route once a week. 1 each 0    Elastic Bandages & Supports (WRIST BRACE/RIGHT SMALL) MISC 1 Device  by Does not apply route daily. (Patient not taking: Reported on 06/05/2023) 1 each 0    Misc. Devices (GOJJI WEIGHT SCALE) MISC 1 Device by Does not apply route every 30 (thirty) days. (Patient not taking: Reported on 06/13/2023) 1 each 0    Prenatal MV & Min w/FA-DHA (PRENATAL ADULT GUMMY/DHA/FA) 0.4-25 MG CHEW Chew 1 tablet by mouth daily. 180 tablet 0      Review of Systems   All systems reviewed and negative except as stated in HPI  Last menstrual period 09/04/2022. General appearance: alert and cooperative Lungs: clear to auscultation bilaterally Heart: regular rate and rhythm Abdomen: soft, non-tender; bowel sounds normal Pelvic: Normal external genitalia Extremities: Homans sign is negative, no sign of DVT Presentation: cephalic Fetal monitoringBaseline: 140 bpm, moderate variability, 15x15 accels  Uterine activity spontaneous non-painful contractions q9 minutes     Prenatal labs: ABO, Rh: O/Positive/-- (06/04 1439) Antibody: Negative (06/04 1439) Rubella: 6.47 (06/04 1439) RPR: Non Reactive (09/09 0921)   HBsAg: Negative (06/04 1439)  HIV: Non Reactive (09/09 0921)  GBS: Negative/-- (11/26 0921)  1 hr Glucola WNL Genetic screening  Low Risk NIPS Anatomy US WNL  Prenatal Transfer Tool  Maternal Diabetes: No Genetic Screening: Normal Maternal Ultrasounds/Referrals: Other: serial growth [place] for history of PreE and AMA Fetal Ultrasounds or other Referrals:  None Maternal Substance Abuse:  No Significant Maternal Medications:  None Significant Maternal Lab Results:  Group B Strep negative Number of Prenatal Visits:greater than 3 verified prenatal visits Other Comments:  None  No results found for this or any previous visit (from the past 24 hour(s)).  Patient Active Problem List   Diagnosis Date Noted   Anemia in pregnancy 06/11/2023   Unwanted fertility 03/19/2023   AMA (advanced maternal age) multigravida 35+ 02/01/2023   Hx of pre-eclampsia in prior pregnancy, currently pregnant 12/12/2022   Supervision of high risk pregnancy, antepartum 12/12/2022    Assessment/Plan:  Kelly Oneal is a 42 y.o. G3P2002 at [redacted]w[redacted]d here for IOL for AMA. Also has a history of pre-eclampsia in a prior pregnancy.   #Labor:Will start induction with buccal cytotec 50mg   #Pain: IV pain meds as needed, not planning epidural  #FWB: Cat 1  #ID: GBS negative #MOF: Breast #MOC:Postpartum BTL #Circ: N/a   Eliezer Mccoy, MD  06/18/2023, 7:32 AM   GME ATTESTATION:  Evaluation and management procedures were performed by the [facility] Medicine Resident under my supervision. I was immediately available for direct supervision, assistance and direction throughout this encounter.  I also confirm that I have verified the information documented in the resident's note, and that I have also personally reperformed the pertinent components of the physical exam and all of the medical decision making activities.  I have also made any necessary editorial changes.  Wyn Forster, MD OB Fellow, Faculty  Practice [facility], Center for [facility] 06/18/2023 5:32 PM

## 2023-06-18 NOTE — Progress Notes (Signed)
Labor Progress Note  Kelly Oneal is a 42 y.o. G3P2002 at [redacted]w[redacted]d presented for IOL for AMA  S: Epidural in place, feeling much more comfortable. No concerns.   O:  BP 106/64   Pulse 87   Temp 97.8 F (36.6 C) (Oral)   Resp 16   LMP 09/04/2022 Comment: states she thinks it was in February  of 2024  SpO2 97%  EFM:130 bpm/Moderate variability/ 15x15 accels/ Variable decels CAT: 2 - for Variables. Overall reassuring  Toco: regular, every 2-4 minutes   CVE: Dilation: 5 Effacement (%): 70 Cervical Position: Middle Station: -2 Presentation: Vertex Exam by:: Dr. Corliss Skains   A&P: 42 y.o. [id number] [redacted]w[redacted]d  here for IOL for AMA as above  #Labor: Progressing well. Comfortable with epidural. Vertex on most recent exam. ON Pitocin, continue titration.  #Pain: Epidural #FWB: CAT 2 #GBS negative #History of PEC in prior pregnancy, plan Lasix PP  #Anemia of pregnancy, Hgb 9.6, plan PO supplementation, recheck PPP    Nelta Numbers, MD Visiting Resident PGY3 - Family Medicine  [facility], Center for [facility] 06/18/23  10:22 PM

## 2023-06-19 ENCOUNTER — Encounter (HOSPITAL_COMMUNITY)
Admission: RE | Disposition: A | Discharge status: Home / Self Care | Payer: Self-pay | Source: Home / Self Care | Attending: Obstetrics and Gynecology

## 2023-06-19 ENCOUNTER — Inpatient Hospital Stay (HOSPITAL_COMMUNITY): Payer: Medicaid Other | Admitting: Anesthesiology

## 2023-06-19 ENCOUNTER — Encounter (HOSPITAL_COMMUNITY): Payer: Self-pay | Admitting: Family Medicine

## 2023-06-19 ENCOUNTER — Ambulatory Visit: Payer: Medicaid Other

## 2023-06-19 DIAGNOSIS — Z302 Encounter for sterilization: Secondary | ICD-10-CM

## 2023-06-19 DIAGNOSIS — O09523 Supervision of elderly multigravida, third trimester: Secondary | ICD-10-CM

## 2023-06-19 DIAGNOSIS — Z3A39 39 weeks gestation of pregnancy: Secondary | ICD-10-CM

## 2023-06-19 HISTORY — PX: TUBAL LIGATION: SHX77

## 2023-06-19 LAB — COMPREHENSIVE METABOLIC PANEL
ALT: 14 U/L (ref 0–44)
AST: 22 U/L (ref 15–41)
Albumin: 2.2 g/dL — ABNORMAL LOW (ref 3.5–5.0)
Alkaline Phosphatase: 188 U/L — ABNORMAL HIGH (ref 38–126)
Anion gap: 11 (ref 5–15)
BUN: 11 mg/dL (ref 6–20)
CO2: 19 mmol/L — ABNORMAL LOW (ref 22–32)
Calcium: 8.6 mg/dL — ABNORMAL LOW (ref 8.9–10.3)
Chloride: 103 mmol/L (ref 98–111)
Creatinine, Ser: 0.77 mg/dL (ref 0.44–1.00)
GFR, Estimated: >60 mL/min (ref >60)
Glucose, Bld: 100 mg/dL — ABNORMAL HIGH (ref 70–99)
Potassium: 3.9 mmol/L (ref 3.5–5.1)
Sodium: 133 mmol/L — ABNORMAL LOW (ref 135–145)
Total Bilirubin: 0.6 mg/dL (ref <1.2)
Total Protein: 5.2 g/dL — ABNORMAL LOW (ref 6.5–8.1)

## 2023-06-19 LAB — MICROALBUMIN / CREATININE URINE RATIO
Creatinine, Urine: 32.3 mg/dL
Microalb Creat Ratio: <9 mg/g{creat} (ref 0–29)
Microalb, Ur: <3 ug/mL — ABNORMAL HIGH

## 2023-06-19 SURGERY — LIGATION, FALLOPIAN TUBE, POSTPARTUM
Anesthesia: Epidural

## 2023-06-19 MED ORDER — OXYCODONE-ACETAMINOPHEN 5-325 MG PO TABS: 1.0000 | ORAL_TABLET | ORAL | Status: DC | PRN

## 2023-06-19 MED ORDER — FAMOTIDINE 20 MG PO TABS
40.0000 mg | ORAL_TABLET | Freq: Once | ORAL | Status: AC
  Administered 2023-06-19: 40 mg via ORAL
  Filled 2023-06-19: qty 2

## 2023-06-19 MED ORDER — ONDANSETRON HCL 4 MG/2ML IJ SOLN: 4.0000 mg | INTRAMUSCULAR | Status: DC | PRN

## 2023-06-19 MED ORDER — OXYCODONE-ACETAMINOPHEN 5-325 MG PO TABS: 2.0000 | ORAL_TABLET | ORAL | Status: DC | PRN

## 2023-06-19 MED ORDER — SENNOSIDES-DOCUSATE SODIUM 8.6-50 MG PO TABS
2.0000 | ORAL_TABLET | Freq: Every day | ORAL | Status: DC
  Administered 2023-06-20 – 2023-06-21 (×2): 2 via ORAL
  Filled 2023-06-19 (×2): qty 2

## 2023-06-19 MED ORDER — DEXMEDETOMIDINE HCL IN NACL 80 MCG/20ML IV SOLN
INTRAVENOUS | Status: DC | PRN
  Administered 2023-06-19: 8 ug via INTRAVENOUS

## 2023-06-19 MED ORDER — WITCH HAZEL-GLYCERIN EX PADS: 1.0000 | MEDICATED_PAD | CUTANEOUS | Status: DC | PRN

## 2023-06-19 MED ORDER — STERILE WATER FOR IRRIGATION IR SOLN
Status: DC | PRN
  Administered 2023-06-19: 1

## 2023-06-19 MED ORDER — FENTANYL CITRATE (PF) 100 MCG/2ML IJ SOLN
INTRAMUSCULAR | Status: AC
  Filled 2023-06-19: qty 2

## 2023-06-19 MED ORDER — LIDOCAINE-EPINEPHRINE (PF) 2 %-1:200000 IJ SOLN
INTRAMUSCULAR | Status: DC | PRN
  Administered 2023-06-19 (×4): 5 mL via EPIDURAL

## 2023-06-19 MED ORDER — SODIUM CHLORIDE 0.9 % IR SOLN
Status: DC | PRN
  Administered 2023-06-19: 1

## 2023-06-19 MED ORDER — BUPIVACAINE HCL (PF) 0.25 % IJ SOLN
INTRAMUSCULAR | Status: DC | PRN
  Administered 2023-06-19: 10 mL

## 2023-06-19 MED ORDER — LIDOCAINE-EPINEPHRINE (PF) 2 %-1:200000 IJ SOLN
INTRAMUSCULAR | Status: AC
  Filled 2023-06-19: qty 20

## 2023-06-19 MED ORDER — DEXAMETHASONE SODIUM PHOSPHATE 4 MG/ML IJ SOLN
INTRAMUSCULAR | Status: DC | PRN
  Administered 2023-06-19: 4 mg via INTRAVENOUS

## 2023-06-19 MED ORDER — FENTANYL CITRATE (PF) 100 MCG/2ML IJ SOLN
INTRAMUSCULAR | Status: DC | PRN
  Administered 2023-06-19: 100 ug via EPIDURAL

## 2023-06-19 MED ORDER — ACETAMINOPHEN 10 MG/ML IV SOLN
INTRAVENOUS | Status: DC | PRN
  Administered 2023-06-19: 1000 mg via INTRAVENOUS

## 2023-06-19 MED ORDER — DIPHENHYDRAMINE HCL 25 MG PO CAPS: 25.0000 mg | ORAL_CAPSULE | Freq: Four times a day (QID) | ORAL | Status: DC | PRN

## 2023-06-19 MED ORDER — LIDOCAINE-EPINEPHRINE (PF) 2 %-1:200000 IJ SOLN: INTRAMUSCULAR | Status: DC | PRN

## 2023-06-19 MED ORDER — COCONUT OIL OIL: 1.0000 | TOPICAL_OIL | Status: DC | PRN

## 2023-06-19 MED ORDER — PRENATAL MULTIVITAMIN CH
1.0000 | ORAL_TABLET | Freq: Every day | ORAL | Status: DC
  Administered 2023-06-19 – 2023-06-20 (×2): 1 via ORAL
  Filled 2023-06-19 (×2): qty 1

## 2023-06-19 MED ORDER — LACTATED RINGERS IV SOLN: INTRAVENOUS | Status: AC

## 2023-06-19 MED ORDER — ONDANSETRON HCL 4 MG/2ML IJ SOLN
INTRAMUSCULAR | Status: DC | PRN
  Administered 2023-06-19: 4 mg via INTRAVENOUS

## 2023-06-19 MED ORDER — DIBUCAINE (PERIANAL) 1 % EX OINT: 1.0000 | TOPICAL_OINTMENT | CUTANEOUS | Status: DC | PRN

## 2023-06-19 MED ORDER — SIMETHICONE 80 MG PO CHEW: 80.0000 mg | CHEWABLE_TABLET | ORAL | Status: DC | PRN

## 2023-06-19 MED ORDER — ACETAMINOPHEN 10 MG/ML IV SOLN
INTRAVENOUS | Status: AC
  Filled 2023-06-19: qty 100

## 2023-06-19 MED ORDER — IBUPROFEN 600 MG PO TABS
600.0000 mg | ORAL_TABLET | Freq: Four times a day (QID) | ORAL | Status: DC
  Administered 2023-06-19 – 2023-06-21 (×8): 600 mg via ORAL
  Filled 2023-06-19 (×8): qty 1

## 2023-06-19 MED ORDER — ONDANSETRON HCL 4 MG PO TABS: 4.0000 mg | ORAL_TABLET | ORAL | Status: DC | PRN

## 2023-06-19 MED ORDER — ONDANSETRON HCL 4 MG/2ML IJ SOLN
INTRAMUSCULAR | Status: AC
  Filled 2023-06-19: qty 2

## 2023-06-19 MED ORDER — METOCLOPRAMIDE HCL 10 MG PO TABS
10.0000 mg | ORAL_TABLET | Freq: Once | ORAL | Status: AC
  Administered 2023-06-19: 10 mg via ORAL
  Filled 2023-06-19: qty 1

## 2023-06-19 MED ORDER — FENTANYL CITRATE (PF) 100 MCG/2ML IJ SOLN
INTRAMUSCULAR | Status: DC | PRN
  Administered 2023-06-19: 50 ug via INTRAVENOUS

## 2023-06-19 MED ORDER — ACETAMINOPHEN 325 MG PO TABS: 650.0000 mg | ORAL_TABLET | ORAL | Status: DC | PRN

## 2023-06-19 MED ORDER — FUROSEMIDE 20 MG PO TABS
20.0000 mg | ORAL_TABLET | Freq: Every day | ORAL | Status: DC
  Administered 2023-06-19 – 2023-06-21 (×3): 20 mg via ORAL
  Filled 2023-06-19 (×3): qty 1

## 2023-06-19 MED ORDER — BENZOCAINE-MENTHOL 20-0.5 % EX AERO: 1.0000 | INHALATION_SPRAY | CUTANEOUS | Status: DC | PRN

## 2023-06-19 MED ORDER — TETANUS-DIPHTH-ACELL PERTUSSIS 5-2.5-18.5 LF-MCG/0.5 IM SUSY: 0.5000 mL | PREFILLED_SYRINGE | Freq: Once | INTRAMUSCULAR | Status: DC

## 2023-06-19 SURGICAL SUPPLY — 22 items
CHLORAPREP W/TINT 26 (MISCELLANEOUS) ×1 IMPLANT
CLIP FILSHIE TUBAL LIGA STRL (Clip) ×1 IMPLANT
CLOTH BEACON ORANGE TIMEOUT ST (SAFETY) ×1 IMPLANT
DERMABOND ADVANCED .7 DNX12 (GAUZE/BANDAGES/DRESSINGS) IMPLANT
DRSG OPSITE POSTOP 3X4 (GAUZE/BANDAGES/DRESSINGS) ×1 IMPLANT
GLOVE BIO SURGEON STRL SZ 6 (GLOVE) ×1 IMPLANT
GLOVE BIOGEL PI IND STRL 7.0 (GLOVE) ×2 IMPLANT
GOWN STRL REUS W/TWL LRG LVL3 (GOWN DISPOSABLE) ×2 IMPLANT
LIGASURE IMPACT 36 18CM CVD LR (INSTRUMENTS) IMPLANT
MAT PREVALON FULL STRYKER (MISCELLANEOUS) IMPLANT
NEEDLE HYPO 22GX1.5 SAFETY (NEEDLE) ×1 IMPLANT
NS IRRIG 1000ML POUR BTL (IV SOLUTION) ×1 IMPLANT
PACK ABDOMINAL MINOR (CUSTOM PROCEDURE TRAY) ×1 IMPLANT
PROTECTOR NERVE ULNAR (MISCELLANEOUS) ×1 IMPLANT
SPONGE LAP 4X18 RFD (DISPOSABLE) IMPLANT
SUT MON AB-0 CT1 36 (SUTURE) IMPLANT
SUT VIC AB 0 CT1 27XBRD ANBCTR (SUTURE) ×1 IMPLANT
SUT VICRYL 4-0 PS2 18IN ABS (SUTURE) ×1 IMPLANT
SYR CONTROL 10ML LL (SYRINGE) ×1 IMPLANT
TOWEL OR 17X24 6PK STRL BLUE (TOWEL DISPOSABLE) ×2 IMPLANT
TRAY FOLEY CATH SILVER 14FR (SET/KITS/TRAYS/PACK) ×1 IMPLANT
WATER STERILE IRR 1000ML POUR (IV SOLUTION) ×1 IMPLANT

## 2023-06-19 NOTE — Progress Notes (Signed)
  Pt desires PPTL and is NPO since delivery.    Reviewed process of procedure. Reviewed possible modes of anesthesia pending how her epidural works. Reviewed incision location and care.    Reviewed risks: risks of surgery include but are not limited to: bleeding, infection, injury to surrounding organs/tissues (i.e. bowel/bladder/ureters), need for additional procedures, wound complications, hospital re-admission, and conversion to open surgery or need for interval surgery due to bleeding, VTE. Reviewed bowel injury risk in setting of umbilical hernia.    Discussed removing as much tube as possible. Reviewed irreversible.    All patient questions answered. Consent reviewed and signed.    Milas Hock, MD Attending Obstetrician & Gynecologist, [facility] for [facility], [facility]

## 2023-06-19 NOTE — Lactation Note (Signed)
This note was copied from a baby's chart. Lactation Consultation Note  Patient Name: Kelly Oneal [id number] Date: 06/19/2023 Age:42 hours Reason for consult: Initial assessment;Term  P3- MOB reports that infant is nursing well and denies experiencing any pain or pinching. MOB currently had infant on the left breast in a laid back position. Infant was actively sucking without needed stimulation. LC noted multiple audible swallows as infant was nursing. MOB denies having any questions or concerns at this moment.  LC reviewed feeding infant on cue 8-12x in 24 hrs, not allowing infant to go over 3 hrs without a feeding, CDC milk storage guidelines and LC services handout. LC encouraged MOB to call for further assistance as needed.  Maternal Data Does the patient have breastfeeding experience prior to this delivery?: Yes How long did the patient breastfeed?: 9 months with second child, first child was formula fed  Feeding Mother's Current Feeding Choice: Breast Milk  Lactation Tools Discussed/Used Pump Education: Milk Storage  Interventions Interventions: Breast feeding basics reviewed;Education;LC Services brochure  Discharge Discharge Education: Warning signs for feeding baby Pump: DEBP;Personal  Consult Status Consult Status: Follow-up Date: 06/20/23 Follow-up type: In-patient    Dema Severin BS, IBCLC 06/19/2023, 6:21 PM

## 2023-06-19 NOTE — Anesthesia Postprocedure Evaluation (Signed)
Anesthesia Post Note  Patient: Kelly Oneal  Procedure(s) Performed: POST PARTUM TUBAL LIGATION     Patient location during evaluation: PACU Anesthesia Type: Epidural Level of consciousness: awake and alert and oriented Pain management: pain level controlled Vital Signs Assessment: post-procedure vital signs reviewed and stable Respiratory status: spontaneous breathing, nonlabored ventilation and respiratory function stable Cardiovascular status: blood pressure returned to baseline and stable Postop Assessment: no headache, no backache, epidural receding and no apparent nausea or vomiting Anesthetic complications: no   No notable events documented.  Last Vitals:  Vitals:   06/19/23 1616 06/19/23 1630  BP: (!) 93/56 97/61  Pulse: 70 68  Resp: (!) 25 (!) 26  Temp:    SpO2:  94%    Last Pain:  Vitals:   06/19/23 1600  TempSrc: Oral  PainSc:                  [name]

## 2023-06-19 NOTE — Transfer of Care (Addendum)
Immediate Anesthesia Transfer of Care Note  Patient: Kelly Oneal  Procedure(s) Performed: POST PARTUM TUBAL LIGATION  Patient Location: PACU  Anesthesia Type:Epidural  Level of Consciousness: awake, alert , and oriented  Airway & Oxygen Therapy: Patient Spontanous Breathing  Post-op Assessment: Report given to RN and Post -op Vital signs reviewed and stable  Post vital signs: Reviewed and stable  Last Vitals:  Vitals Value Taken Time  BP 98/55 06/19/23 1600  Temp    Pulse 72 06/19/23 1602  Resp 32 06/19/23 1602  SpO2 93 % 06/19/23 1602  Vitals shown include unfiled device data.  Last Pain:  Vitals:   06/19/23 1600  TempSrc: Tympanic  PainSc:          Complications: No notable events documented.

## 2023-06-19 NOTE — Plan of Care (Signed)
  Problem: Education: Goal: Knowledge of General Education information will improve Description: Including pain rating scale, medication(s)/side effects and non-pharmacologic comfort measures Outcome: Progressing   Problem: Health Behavior/Discharge Planning: Goal: Ability to manage health-related needs will improve Outcome: Progressing   Problem: Clinical Measurements: Goal: Ability to maintain clinical measurements within normal limits will improve Outcome: Progressing Goal: Will remain free from infection Outcome: Progressing Goal: Diagnostic test results will improve Outcome: Progressing Goal: Respiratory complications will improve Outcome: Progressing Goal: Cardiovascular complication will be avoided Outcome: Progressing   Problem: Activity: Goal: Risk for activity intolerance will decrease Outcome: Progressing   Problem: Nutrition: Goal: Adequate nutrition will be maintained Outcome: Progressing   Problem: Coping: Goal: Level of anxiety will decrease Outcome: Progressing   Problem: Elimination: Goal: Will not experience complications related to bowel motility Outcome: Progressing Goal: Will not experience complications related to urinary retention Outcome: Progressing   Problem: Pain Management: Goal: General experience of comfort will improve Outcome: Progressing   Problem: Safety: Goal: Ability to remain free from injury will improve Outcome: Progressing   Problem: Skin Integrity: Goal: Risk for impaired skin integrity will decrease Outcome: Progressing   Problem: Education: Goal: Knowledge of Childbirth will improve Outcome: Progressing Goal: Ability to make informed decisions regarding treatment and plan of care will improve Outcome: Progressing Goal: Ability to state and carry out methods to decrease the pain will improve Outcome: Progressing Goal: Individualized Educational Video(s) Outcome: Progressing   Problem: Coping: Goal: Ability to  verbalize concerns and feelings about labor and delivery will improve Outcome: Progressing   Problem: Life Cycle: Goal: Ability to make normal progression through stages of labor will improve Outcome: Progressing Goal: Ability to effectively push during vaginal delivery will improve Outcome: Progressing   Problem: Role Relationship: Goal: Will demonstrate positive interactions with the child Outcome: Progressing   Problem: Safety: Goal: Risk of complications during labor and delivery will decrease Outcome: Progressing   Problem: Pain Management: Goal: Relief or control of pain from uterine contractions will improve Outcome: Progressing

## 2023-06-19 NOTE — Consult Note (Signed)
Kelly Oneal June 11, 1981  [id number].    Requesting MD: Milas Hock, MD Chief Complaint/Reason for Consult: umbilical hernia   HPI:  Kelly Oneal is a 42 y/o F who was admitted for induction of labor at [redacted]w[redacted]d pregnant. She had an uncomplicated vaginal delivery of her daughter this morning. She has a history of previous vaginal delivery in 2011 in [place] complicated by postpartum cardiomegaly. She tells me that her doctor in [place] told her that she gained a lot of weight during pregnancy and delivered a large baby, which put a lot of stress on her heart. She denies any associated chest pain, SOB, or pulmonary complications at that time. She denies any chest pain, DOE, or cardiac issues since that time. Otherwise she states she is healthy and takes no daily medications, just prenatal vitamin. Regarding her hernia she says it has been present since 2011. She says it is always soft and is occasionally sore. She denies nausea, vomiting, or constipation. She lives in [place] with her husband and child. Her mother is at the bedside. She denies use of tobacco, alcohol, or blood thinners. She denies a history of abdominal surgery or general anesthesia.   ROS: Review of Systems  All other systems reviewed and are negative.   Family History  Problem Relation Age of Onset   Hypertension Maternal Grandmother    Hypertension Maternal Grandfather    Asthma Neg Hx    Cancer Neg Hx    Diabetes Neg Hx    Heart disease Neg Hx     Past Medical History:  Diagnosis Date   Anemia    Cardiomegaly    per patient with first delivery 2010 in [place], no follow up needed, chest x-ray for fall in 2021 showed normal heart size   Dyspnea    Family history of adverse reaction to anesthesia    mom heart stopped with anesthesia not know type   Hx of pre-eclampsia in prior pregnancy, currently pregnant    Kidney stones    Pulmonary hypertension (HCC)    per patient with first  delivery 2010 in [place], no follow up needed    Past Surgical History:  Procedure Laterality Date   NO PAST SURGERIES      Social History:  reports that she has never smoked. She has never been exposed to tobacco smoke. She has never used smokeless tobacco. She reports that she does not drink alcohol and does not use drugs.  Allergies:  Allergies  Allergen Reactions   Food Color Red Diarrhea   Penicillins Rash    Medications Prior to Admission  Medication Sig Dispense Refill   aspirin EC 81 MG tablet Take 1 tablet (81 mg total) by mouth daily. Swallow whole. 30 tablet 12   Blood Pressure Monitoring (BLOOD PRESSURE KIT) DEVI 1 kit by Does not apply route once a week. 1 each 0   Elastic Bandages & Supports (WRIST BRACE/RIGHT SMALL) MISC 1 Device by Does not apply route daily. (Patient not taking: Reported on 06/05/2023) 1 each 0   Misc. Devices (GOJJI WEIGHT SCALE) MISC 1 Device by Does not apply route every 30 (thirty) days. (Patient not taking: Reported on 06/13/2023) 1 each 0   Prenatal MV & Min w/FA-DHA (PRENATAL ADULT GUMMY/DHA/FA) 0.4-25 MG CHEW Chew 1 tablet by mouth daily. 180 tablet 0     Physical Exam: Blood pressure (!) 101/58, pulse (!) 59, temperature 98 F (36.7 C), resp. rate 18, height 4\' 11"  (1.499 m), weight 75  kg, last menstrual period 09/04/2022, SpO2 100%, unknown if currently breastfeeding. General: Pleasant female lying in hospital bed, NAD HEENT: head -normocephalic, atraumatic; Eyes: PERRLA, no conjunctival injection;anicteric sclerae Neck- Trachea is midline, no thyromegaly or JVD appreciated.  CV- RRR, no lower extremity edema  Pulm- breathing is non-labored ORA  Abd- soft, non-tender, palpable uterus in the LLQ/SP region to the level of the umbilicus, she has a soft umbilical hernia that does not contain bowel, no rebound tenderness or guarding.  GU- deferred  MSK- UE/LE symmetrical, no cyanosis, clubbing, or edema. Neuro- CN II-XII grossly in tact, no  paresthesias. Psych- Alert and Oriented x3 with appropriate affect Skin: warm and dry, no rashes or lesions   Results for orders placed or performed during the hospital encounter of 06/18/23 (from the past 48 hour(s))  CBC     Status: Abnormal   Collection Time: 06/18/23  7:39 AM  Result Value Ref Range   WBC 6.9 4.0 - 10.5 K/uL   RBC 4.20 3.87 - 5.11 MIL/uL   Hemoglobin 9.6 (L) 12.0 - 15.0 g/dL   HCT 40.9 (L) 81.1 - 91.4 %   MCV 76.4 (L) 80.0 - 100.0 fL   MCH 22.9 (L) 26.0 - 34.0 pg   MCHC 29.9 (L) 30.0 - 36.0 g/dL   RDW 78.2 95.6 - 21.3 %   Platelets 185 150 - 400 K/uL   nRBC 0.0 0.0 - 0.2 %    Comment: Performed at [facility], [address], [place], [place] [address]  RPR     Status: None   Collection Time: 06/18/23  7:39 AM  Result Value Ref Range   RPR Ser Ql NON REACTIVE NON REACTIVE    Comment: Performed at [facility], [address] [place], [place] [address]  Comprehensive metabolic panel     Status: Abnormal   Collection Time: 06/18/23  8:02 AM  Result Value Ref Range   Sodium 136 135 - 145 mmol/L   Potassium 4.0 3.5 - 5.1 mmol/L   Chloride 106 98 - 111 mmol/L   CO2 21 (L) 22 - 32 mmol/L   Glucose, Bld 79 70 - 99 mg/dL    Comment: Glucose reference range applies only to samples taken after fasting for at least 8 hours.   BUN 10 6 - 20 mg/dL   Creatinine, Ser 2.95 0.44 - 1.00 mg/dL   Calcium 8.9 8.9 - 28.4 mg/dL   Total Protein 5.5 (L) 6.5 - 8.1 g/dL   Albumin 2.4 (L) 3.5 - 5.0 g/dL   AST 22 15 - 41 U/L   ALT 13 0 - 44 U/L   Alkaline Phosphatase 176 (H) 38 - 126 U/L   Total Bilirubin 0.4 <1.2 mg/dL   GFR, Estimated >13 >24 mL/min    Comment: (NOTE) Calculated using the CKD-EPI Creatinine Equation (2021)    Anion gap 9 5 - 15    Comment: Performed at [facility], [address], [place], [place] [address]  Type and screen     Status: None   Collection Time: 06/18/23  9:04 AM  Result Value Ref Range   ABO/RH(D) O POS    Antibody  Screen NEG    Sample Expiration      06/21/2023,2359 Performed at [facility], [address], [place], [place] [address]   Comprehensive metabolic panel     Status: Abnormal   Collection Time: 06/19/23  2:04 AM  Result Value Ref Range  Sodium 133 (L) 135 - 145 mmol/L   Potassium 3.9 3.5 - 5.1 mmol/L   Chloride 103 98 - 111 mmol/L   CO2 19 (L) 22 - 32 mmol/L   Glucose, Bld 100 (H) 70 - 99 mg/dL    Comment: Glucose reference range applies only to samples taken after fasting for at least 8 hours.   BUN 11 6 - 20 mg/dL   Creatinine, Ser 4.09 0.44 - 1.00 mg/dL   Calcium 8.6 (L) 8.9 - 10.3 mg/dL   Total Protein 5.2 (L) 6.5 - 8.1 g/dL   Albumin 2.2 (L) 3.5 - 5.0 g/dL   AST 22 15 - 41 U/L   ALT 14 0 - 44 U/L   Alkaline Phosphatase 188 (H) 38 - 126 U/L   Total Bilirubin 0.6 <1.2 mg/dL   GFR, Estimated >81 >19 mL/min    Comment: (NOTE) Calculated using the CKD-EPI Creatinine Equation (2021)    Anion gap 11 5 - 15    Comment: Performed at [facility], [address] [place], [place] [address]   No results found.    Assessment/Plan Umbilical hernia 42 y/o F s/p vaginal delivery today 12/10 who also has an umbilical hernia without signs of bowel obstruction or complicating feature. We were asked to consider umbilical hernia repair at the time of her tubal ligation this admission. I would recommend waiting for her abdominal wall to recover from pregnancy prior to planning hernia repair. Allowing for her abdominal wall muscles to come together and recover would allow for a more successful repair and decreased risk of hernia recurrence in the future. She can make an appointment with [place] in 8 or more weeks to discuss elective repair. I have placed our office information on her AVS.   I reviewed nursing notes, Consultant OBGYN notes, last 24 h vitals and pain scores, last 48 h intake and output, last 24 h labs and trends, and last 24 h imaging results.  Adam Phenix,  [facility] 06/19/2023, 11:24 AM Please see Amion for pager number during day hours 7:00am-4:30pm or 7:00am -11:30am on weekends

## 2023-06-19 NOTE — Op Note (Signed)
Miosotis Pendley Suman 06/18/2023 - 06/19/2023  PREOPERATIVE DIAGNOSIS:  Undesired fertility  POSTOPERATIVE DIAGNOSIS:  Undesired fertility  PROCEDURE:  Postpartum Bilateral Tubal Sterilization using Ligasure   SURGEON:  Dr Milas Hock   Assistant: Hessie Dibble, MD An experienced assistant was required given the standard of surgical care given the complexity of the case.  This assistant was needed for exposure, dissection, suctioning, retraction, instrument exchange and for overall help during the procedure.  ANESTHESIA:  Epidural  COMPLICATIONS:  None immediate.  ESTIMATED BLOOD LOSS:  Less than 20cc.  URINE OUTPUT:  1300 mL of clear urine.  INDICATIONS: 42 y.o. yo [id number]  with undesired fertility,status post vaginal delivery, desires permanent sterilization. Risks and benefits of procedure discussed with patient including permanence of method, bleeding, infection, injury to surrounding organs and need for additional procedures. Risk failure of 0.5-1% with increased risk of ectopic gestation if pregnancy occurs was also discussed with patient.   FINDINGS:  Previously documented umbilical hernia (easily reducable), normal yet boggy uterus, tubes, and ovaries.  TECHNIQUE:  The patient was taken to the operating room where her epidural anesthesia was dosed up to surgical level and found to be adequate.  She was then placed in the dorsal supine position and prepped and draped in sterile fashion.  After an adequate timeout was performed, attention was turned to the patient's abdomen where a small verticle skin incision was made through the uumbilicus. The incision was taken down to what remained of the layer of fascia using the scalpel, and fascia was incised, and extended bilaterally using Mayo scissors. The peritoneum was entered in a blunt fashion.   Attention was then turned to the patient's uterus, and right fallopian tube was identified and followed out to the fimbriated end.   Ligasure device was used to cauterize and cut the mesosalpinx to proximal end of the fallopian tube, removing 6cm of tube. A similar process was carried out on the left side allowing for bilateral tubal sterilization.    Good hemostasis was noted overall.  The instruments were then removed from the patient's abdomen and the fascial incision was repaired with 0 Vicryl, and the skin was closed with a 3-0 Monocryl subcuticular stitch. Local analgesia was injected in operative site laceration. The patient tolerated the procedure well.  Sponge, lap, and needle counts were correct times two.  The patient was then taken to the recovery room awake, extubated and in stable condition.   Hessie Dibble, MD FMOB Fellow, Faculty practice [facility], Center for [facility] 06/19/23  3:57 PM

## 2023-06-19 NOTE — Anesthesia Preprocedure Evaluation (Addendum)
Anesthesia Evaluation  Patient identified by MRN, date of birth, ID band Patient awake    Reviewed: Allergy & Precautions, NPO status , Patient's Chart, lab work & pertinent test results  History of Anesthesia Complications (+) Family history of anesthesia reaction and history of anesthetic complications (pt mother-per pt, heart stopped but she survived)  Airway Mallampati: III  TM Distance: >3 FB Neck ROM: Full    Dental  (+) Teeth Intact, Dental Advisory Given   Pulmonary neg pulmonary ROS   Pulmonary exam normal breath sounds clear to auscultation       Cardiovascular Normal cardiovascular exam Rhythm:Regular Rate:Normal  Per pt was told she had pulmonary hypertension and cardiomegaly after delivery in 2010, no records of this and was told she did not need followup. Also had preE during 2010 pregnancy   Neuro/Psych negative neurological ROS  negative psych ROS   GI/Hepatic negative GI ROS, Neg liver ROS,,,  Endo/Other  BMI 34  Renal/GU negative Renal ROS  negative genitourinary   Musculoskeletal negative musculoskeletal ROS (+)    Abdominal  (+) + obese  Peds  Hematology  (+) Blood dyscrasia, anemia Hb 9.6 pre-delivery minimal EBL during delivery  Plt 185   Anesthesia Other Findings   Reproductive/Obstetrics Postpartum, delivered overnight w/ epidural in place                             Anesthesia Physical Anesthesia Plan  ASA: 3  Anesthesia Plan: Epidural   Post-op Pain Management: Ofirmev IV (intra-op)* and Toradol IV (intra-op)*   Induction:   PONV Risk Score and Plan: 2 and Treatment may vary due to age or medical condition, Ondansetron and Dexamethasone  Airway Management Planned: Natural Airway  Additional Equipment: None  Intra-op Plan:   Post-operative Plan:   Informed Consent: I have reviewed the patients History and Physical, chart, labs and discussed the  procedure including the risks, benefits and alternatives for the proposed anesthesia with the patient or authorized representative who has indicated his/her understanding and acceptance.       Plan Discussed with: CRNA  Anesthesia Plan Comments:         Anesthesia Quick Evaluation

## 2023-06-19 NOTE — Lactation Note (Signed)
This note was copied from a baby's chart. Lactation Consultation Note  Patient Name: Kelly Oneal [id number] Date: 06/19/2023 Age:42 hours  P3, Maternal grandmother in room while mother is having BTL.  States the mother of the baby is experienced with breastfeeding.  Baby has latched x 3 since birth.  Provided lactation information sheet and suggest mother call for help with lactation as needed.  Lactation to follow up.    Maternal Data Does the patient have breastfeeding experience prior to this delivery?: Yes  Interventions Interventions: [facility] Services brochure   Hardie Pulley  RN IBCLC 06/19/2023, 2:48 PM

## 2023-06-19 NOTE — Discharge Summary (Addendum)
Postpartum Discharge Summary  Date of Service updated***     Patient Name: Kelly Oneal DOB: 03/31/1981 MRN: [id number]  Date of admission: 06/18/2023 Delivery date:06/19/2023 Delivering provider: Jacklyn Shell Date of discharge: 06/20/2023  Admitting diagnosis: AMA (advanced maternal age) multigravida 35+ [O09.529] Intrauterine pregnancy: [redacted]w[redacted]d     Secondary diagnosis:   Additional problems: hx cardiomegaly/pulm HTN; unwanted fertility; anemia in preg; hx pre-e in prev pregnancy   Discharge diagnosis: Term Pregnancy Delivered                                              Post partum procedures:postpartum tubal ligation Augmentation: Pitocin Complications: None  Hospital course: Induction of Labor With Vaginal Delivery   42 y.o. yo G3P3003 at [redacted]w[redacted]d was admitted to the hospital 06/18/2023 for induction of labor.  Indication for induction: AMA.  Patient had an labor course complicated by nothing Membrane Rupture Time/Date: 1:44 PM,06/18/2023  Delivery Method:Vaginal, Spontaneous Operative Delivery:N/A Episiotomy: None Lacerations:  None Details of delivery can be found in separate delivery note.  Patient had a postpartum course complicated by having a ppBTL on PPD#0 which she tolerated well (umb hernia noted to be easily reducible during this procedure). She was given a PP course of Lasix due to her hx of cardiomegaly and pre-e; BPs postpartum remained normotensive. Patient is discharged home 06/20/23.  Newborn Data: Birth date:06/19/2023 Birth time:4:13 AM Gender:Female Living status:Living Apgars:8 ,9  Weight:3147 g (6lb 15oz)  Magnesium Sulfate received: No BMZ received: No Rhophylac:N/A MMR:N/A T-DaP: declined Flu: No RSV Vaccine received: No Transfusion:No  Immunizations received: Immunization History  Administered Date(s) Administered   Tdap 12/08/2021    Physical exam  Vitals:   06/20/23 0245 06/20/23 0626 06/20/23 1425  06/20/23 2053  BP: (!) 93/48 (!) 106/55 121/69 117/61  Pulse: 79 70 61 67  Resp: 18 18 20 18   Temp: 98.1 F (36.7 C) 97.8 F (36.6 C) 98.2 F (36.8 C) 98.1 F (36.7 C)  TempSrc: Oral Oral Oral Oral  SpO2: 99% 99%    Weight:      Height:       General: alert and cooperative Lochia: appropriate Uterine Fundus: {Desc; firm/soft:30687} Incision: {Exam; incision:21111123} DVT Evaluation: {Exam; dvt:2111122} Labs: Lab Results  Component Value Date   WBC 6.9 06/18/2023   HGB 9.6 (L) 06/18/2023   HCT 32.1 (L) 06/18/2023   MCV 76.4 (L) 06/18/2023   PLT 185 06/18/2023      Latest Ref Rng & Units 06/19/2023    2:04 AM  CMP  Glucose 70 - 99 mg/dL 782   BUN 6 - 20 mg/dL 11   Creatinine 9.56 - 1.00 mg/dL 2.13   Sodium 086 - 578 mmol/L 133   Potassium 3.5 - 5.1 mmol/L 3.9   Chloride 98 - 111 mmol/L 103   CO2 22 - 32 mmol/L 19   Calcium 8.9 - 10.3 mg/dL 8.6   Total Protein 6.5 - 8.1 g/dL 5.2   Total Bilirubin <4.6 mg/dL 0.6   Alkaline Phos 38 - 126 U/L 188   AST 15 - 41 U/L 22   ALT 0 - 44 U/L 14    Edinburgh Score:    06/19/2023   11:45 AM  Edinburgh Postnatal Depression Scale Screening Tool  I have been able to laugh and see the funny side of things. 0  I have looked  forward with enjoyment to things. 0  I have blamed myself unnecessarily when things went wrong. 1  I have been anxious or worried for no good reason. 2  I have felt scared or panicky for no good reason. 0  Things have been getting on top of me. 0  I have been so unhappy that I have had difficulty sleeping. 0  I have felt sad or miserable. 0  I have been so unhappy that I have been crying. 0  The thought of harming myself has occurred to me. 0  Edinburgh Postnatal Depression Scale Total 3   Edinburgh Postnatal Depression Scale Total: 3   After visit meds:  Allergies as of 06/20/2023       Reactions   Food Color Red Diarrhea   Penicillins Rash     Med Rec must be completed prior to using this  SMARTLINK***        Discharge home in stable condition Infant Feeding: {Baby feeding:23562} Infant Disposition:{CHL IP OB HOME WITH ZOXWRU:04540} Discharge instruction: per After Visit Summary and Postpartum booklet. Activity: Advance as tolerated. Pelvic rest for 6 weeks.  Diet: routine diet Future Appointments: Future Appointments  Date Time Provider Department Center  07/31/2023 10:15 AM Leftwich-Kirby, Wilmer Floor, CNM [facility] None   Follow up Visit:  Follow-up Information     Sur[facility], [facility]. Schedule an appointment as soon as possible for a visit in 8 week(s).   Specialty: General Surgery Why: to discuss hernia repair if desired. Contact information: [address] [place] [address] [phone]                  Please schedule this patient for a In person postpartum visit in 4 weeks with the following provider: Any provider. Additional Postpartum F/U:  Low risk pregnancy complicated by:  Delivery mode:  Vaginal, Spontaneous Anticipated Birth Control:  BTL done PP   06/20/2023 Arabella Merles, CNM

## 2023-06-20 NOTE — Anesthesia Postprocedure Evaluation (Signed)
Anesthesia Post Note  Patient: Kelly Oneal  Procedure(s) Performed: AN AD HOC LABOR EPIDURAL     Patient location during evaluation: Mother Baby Anesthesia Type: Epidural Level of consciousness: awake and alert Pain management: pain level controlled Vital Signs Assessment: post-procedure vital signs reviewed and stable Respiratory status: spontaneous breathing Cardiovascular status: stable Postop Assessment: no headache, no backache, epidural receding, patient able to bend at knees, no apparent nausea or vomiting, able to ambulate and adequate PO intake Anesthetic complications: no   No notable events documented.  Last Vitals:  Vitals:   06/20/23 0245 06/20/23 0626  BP: (!) 93/48 (!) 106/55  Pulse: 79 70  Resp: 18 18  Temp: 36.7 C 36.6 C  SpO2: 99% 99%    Last Pain:  Vitals:   06/20/23 0904  TempSrc:   PainSc: 0-No pain   Pain Goal:                   Harrold Donath [place]

## 2023-06-20 NOTE — Progress Notes (Shared)
POSTPARTUM PROGRESS NOTE  Post Partum Day 1  Subjective:  Kelly Oneal is a 42 y.o. U9W1191 s/p SVD at [redacted]w[redacted]d.  She reports she is doing well. No acute events overnight. She denies any problems with ambulation or po intake. She still has a urinary catheter. Denies nausea or vomiting.  Pain is well controlled.  Lochia is minimal.  Objective: Blood pressure (!) 106/55, pulse 70, temperature 97.8 F (36.6 C), temperature source Oral, resp. rate 18, height 4\' 11"  (1.499 m), weight 75 kg, last menstrual period 09/04/2022, SpO2 99%, unknown if currently breastfeeding.  Physical Exam:  General: alert, cooperative and no distress Chest: no respiratory distress Heart:regular rate, distal pulses intact Uterine Fundus: firm, appropriately tender DVT Evaluation: No calf swelling or tenderness Extremities: no edema Skin: warm, dry  Recent Labs    06/18/23 0739  HGB 9.6*  HCT 32.1*    Assessment/Plan: Chauntee Rosencrans is a 42 y.o. Y7W2956 s/p SVD at [redacted]w[redacted]d   PPD#1 - Doing well  Routine postpartum care *** Contraception: s/p BTL Feeding: breast Dispo: Plan for discharge 12/12.   LOS: 2 days   Nicholes Stairs, Medical Student 06/20/2023, 7:54 AM

## 2023-06-20 NOTE — Progress Notes (Signed)
POSTPARTUM PROGRESS NOTE  Post Partum Day 1  Subjective:  Kelly Oneal is a 42 y.o. W2N5621 s/p SVD at [redacted]w[redacted]d.  She reports she is doing well. No acute events overnight. She denies any problems with ambulating, voiding or po intake. Denies nausea or vomiting.  Pain is well controlled.  Lochia is similar to menses. She specifically denies any issues with chest pain, difficulty breathing, LE edema.   Objective: Blood pressure (!) 106/55, pulse 70, temperature 97.8 F (36.6 C), temperature source Oral, resp. rate 18, height 4\' 11"  (1.499 m), weight 75 kg, last menstrual period 09/04/2022, SpO2 99%, unknown if currently breastfeeding.  Physical Exam:  General: alert, cooperative and no distress Chest: LCTAB, no crackles, wheeze, comfortable work of breathing  Heart:regular rate and rhythm, no m/g/r, distal pulses intact Uterine Fundus: firm, appropriately tender. Incisions are c/d/I  DVT Evaluation: No calf swelling or tenderness Extremities: no edema Skin: warm, dry  Recent Labs    06/18/23 0739  HGB 9.6*  HCT 32.1*    Assessment/Plan: Kelly Oneal is a 42 y.o. [id number] s/p SVD at [redacted]w[redacted]d and POD#1 BTL for satisfied parity   PPD#1 - Doing well  Routine postpartum care Hx Pre-eclampsia and cardiomegaly in previous pregnancy. Continue lasix daily.  Contraception: BTL  Feeding: breast  Dispo: Plan for discharge on PPD2 .   LOS: 2 days   Nelta Numbers, MD OB Fellow  06/20/2023, 8:35 AM

## 2023-06-21 ENCOUNTER — Other Ambulatory Visit (HOSPITAL_COMMUNITY): Payer: Self-pay

## 2023-06-21 ENCOUNTER — Encounter (HOSPITAL_COMMUNITY): Payer: Self-pay | Admitting: Family Medicine

## 2023-06-21 ENCOUNTER — Encounter: Payer: Medicaid Other | Admitting: Obstetrics and Gynecology

## 2023-06-21 LAB — SURGICAL PATHOLOGY

## 2023-06-21 MED ORDER — FUROSEMIDE 20 MG PO TABS
20.0000 mg | ORAL_TABLET | Freq: Every day | ORAL | 0 refills | Status: DC
  Filled 2023-06-21: qty 5, 5d supply, fill #0

## 2023-06-21 MED ORDER — IBUPROFEN 600 MG PO TABS
600.0000 mg | ORAL_TABLET | Freq: Four times a day (QID) | ORAL | 0 refills | Status: DC | PRN
  Filled 2023-06-21: qty 30, 8d supply, fill #0

## 2023-06-21 MED ORDER — OXYCODONE-ACETAMINOPHEN 5-325 MG PO TABS
1.0000 | ORAL_TABLET | ORAL | 0 refills | Status: DC | PRN
  Filled 2023-06-21: qty 10, 2d supply, fill #0

## 2023-06-21 NOTE — Lactation Note (Signed)
This note was copied from a baby's chart. Lactation Consultation Note  Patient Name: Kelly Oneal [id number] Date: 06/21/2023 Age:42 hours Reason for consult: Follow-up assessment;Term;Maternal discharge  P3, 39 wks, 53 hrs of life. Encouraged mom in cluster feeding overnight normal/ brings milk in. Encouraged hand expression to start and breast compression to keep baby working at breast. Highlighted expectations as baby readiness and moms milk progresses. Per mom- has a pump at home. Encouraged EBM or coconut oil to nipple post feed. Engorgement prevention and treatment- moving milk, motrin, and ice- highlighted with mom. Hand-outs on LC services, milk storage, and pump cleaning shared with mom.   Maternal Data Does the patient have breastfeeding experience prior to this delivery?: Yes  Feeding Mother's Current Feeding Choice: Breast Milk  Lactation Tools Discussed/Used Pump Education: Milk Storage  Interventions Interventions: Breast feeding basics reviewed;Hand express;Breast compression;Expressed milk;Coconut oil;Education;LC Services brochure;CDC Guidelines for Breast Pump Cleaning  Discharge Discharge Education: Engorgement and breast care Pump: DEBP;Personal  Consult Status Consult Status: Complete Date: 06/21/23 Follow-up type: In-patient    [facility] 06/21/2023, 10:14 AM

## 2023-06-24 ENCOUNTER — Other Ambulatory Visit: Payer: Self-pay | Admitting: Student

## 2023-06-26 NOTE — Telephone Encounter (Signed)
Unsure if appropriate to refill. Patient recently pregnant (delivered 1 week ago). Dr. Andrey Campanile please advise. Thank you.

## 2023-06-27 NOTE — Telephone Encounter (Signed)
Thank you :)

## 2023-06-28 ENCOUNTER — Ambulatory Visit (INDEPENDENT_AMBULATORY_CARE_PROVIDER_SITE_OTHER): Payer: Medicaid Other

## 2023-06-28 ENCOUNTER — Encounter: Payer: Medicaid Other | Admitting: Obstetrics and Gynecology

## 2023-06-28 VITALS — BP 122/75 | HR 66 | Wt 147.0 lb

## 2023-06-28 DIAGNOSIS — Z0130 Encounter for examination of blood pressure without abnormal findings: Secondary | ICD-10-CM

## 2023-06-28 NOTE — Progress Notes (Signed)
..  Subjective:  Kelly Oneal is a 42 y.o. female here for BP check.   Hypertension ROS: no TIA's, no chest pain on exertion, no dyspnea on exertion, and no swelling of ankles.    Objective:  BP 122/75   Pulse 66   Wt 147 lb (66.7 kg)   LMP 09/04/2022 Comment: states she thinks it was in February  of 2024  Breastfeeding Yes   BMI 29.69 kg/m   Appearance alert, well appearing, and in no distress. General exam BP noted to be well controlled today in office.    Assessment:   Blood Pressure well controlled.   Plan:  Current treatment plan is effective, no change in therapy. Advised pt to follow up at pp visit scheduled for 07/30/22 and to call or report to mau if abnormal symptoms should occur. Pt states that she has BP cuff at home.

## 2023-07-05 ENCOUNTER — Encounter: Payer: Medicaid Other | Admitting: Family Medicine

## 2023-07-31 ENCOUNTER — Ambulatory Visit: Payer: Medicaid Other | Admitting: Advanced Practice Midwife

## 2023-07-31 DIAGNOSIS — O9902 Anemia complicating childbirth: Secondary | ICD-10-CM

## 2023-07-31 NOTE — Progress Notes (Signed)
Post Partum Visit Note  Kelly Oneal is a 43 y.o. G52P3003 female who presents for a postpartum visit. She is 6 weeks postpartum following a normal spontaneous vaginal delivery.  I have fully reviewed the prenatal and intrapartum course. The delivery was at 39 gestational weeks.  Anesthesia: epidural. Postpartum course has been good. Baby is doing well yes. Baby is feeding by both breast and bottle - Similac Sensitive RS. Bleeding no bleeding. Bowel function is normal. Bladder function is normal. Patient is not sexually active. Contraception method is tubal ligation. Postpartum depression screening: negative.   The pregnancy intention screening data noted above was reviewed. Potential methods of contraception were discussed. The patient elected to proceed with No data recorded.   Edinburgh Postnatal Depression Scale - 07/31/23 1032       Edinburgh Postnatal Depression Scale:  In the Past 7 Days   I have been able to laugh and see the funny side of things. 0    I have looked forward with enjoyment to things. 0    I have blamed myself unnecessarily when things went wrong. 0    I have been anxious or worried for no good reason. 0    I have felt scared or panicky for no good reason. 0    Things have been getting on top of me. 1    I have been so unhappy that I have had difficulty sleeping. 0    I have felt sad or miserable. 0    I have been so unhappy that I have been crying. 0    The thought of harming myself has occurred to me. 0    Edinburgh Postnatal Depression Scale Total 1             Health Maintenance Due  Topic Date Due   INFLUENZA VACCINE  Never done   COVID-19 Vaccine (1 - 2024-25 season) Never done    The following portions of the patient's history were reviewed and updated as appropriate: allergies, current medications, past family history, past medical history, past social history, past surgical history, and problem list.  Review of  Systems Pertinent items are noted in HPI.  Objective:  BP (!) 86/61   Pulse (!) 55   Wt 147 lb 12.8 oz (67 kg)   LMP 09/04/2022 Comment: states she thinks it was in February  of 2024  Breastfeeding Yes   BMI 29.85 kg/m    General:  alert, cooperative, appears stated age, and no distress   Breasts:  normal  Lungs: clear to auscultation bilaterally  Heart:  RRR  Abdomen: soft, non-tender; bowel sounds normal; no masses,  no organomegaly   Wound NA  GU exam:  not indicated ( patient declined)       Assessment:    1. Anemia of mother during pregnancy, delivered - Taking PNV - CBC  2. Encounter for postpartum care and examination after delivery (Primary) - NM exam  - Mammo   Normal postpartum exam.   Plan:   Essential components of care per ACOG recommendations:  1.  Mood and well being: Patient with positive depression screening today. Reviewed local resources for support.  - Patient tobacco use? No.   - hx of drug use? No.    2. Infant care and feeding:  -Patient currently breastmilk feeding? Yes. Discussed returning to work and pumping.  -Social determinants of health (SDOH) reviewed in EPIC. No concerns at this time  3. Sexuality, contraception and birth spacing -  Patient does not want a pregnancy in the next year.  Desired family size is 3 children.  - Reviewed reproductive life planning. Reviewed contraceptive methods based on pt preferences and effectiveness.  Patient desired Female Sterilization today.   - Discussed birth spacing of 18 months  4. Sleep and fatigue -Encouraged family/partner/community support of 4 hrs of uninterrupted sleep to help with mood and fatigue  5. Physical Recovery  - Discussed patients delivery and complications. She describes her labor as good. - Patient had a Vaginal, no problems at delivery. Patient had a  none  laceration. Perineal healing reviewed. Patient expressed understanding - Patient has urinary incontinence? No. -  Patient is safe to resume physical and sexual activity  6.  Health Maintenance - HM due items addressed Yes - Last pap smear  Diagnosis  Date Value Ref Range Status  11/01/2021   Final   - Negative for intraepithelial lesion or malignancy (NILM)   Pap smear not done at today's visit.   -Breast Cancer screening indicated? Yes. Patient referred today for mammogram.   7. Chronic Disease/Pregnancy Condition follow up: Anemia, Hypertension, and cardiomegaly . Patient will F/U with PCP. ( BP's today Stable, CBC ordered, continue PNV)   - PCP follow up  Marcell Barlow, MSN, [facility], Center for [employer]

## 2023-08-06 ENCOUNTER — Other Ambulatory Visit: Payer: Medicaid Other

## 2023-08-06 DIAGNOSIS — O9902 Anemia complicating childbirth: Secondary | ICD-10-CM

## 2023-08-07 LAB — CBC
Hematocrit: 40.3 % (ref 34.0–46.6)
Hemoglobin: 12.4 g/dL (ref 11.1–15.9)
MCH: 23.6 pg — ABNORMAL LOW (ref 26.6–33.0)
MCHC: 30.8 g/dL — ABNORMAL LOW (ref 31.5–35.7)
MCV: 77 fL — ABNORMAL LOW (ref 79–97)
Platelets: 283 10*3/uL (ref 150–450)
RBC: 5.26 x10E6/uL (ref 3.77–5.28)
RDW: 18.1 % — ABNORMAL HIGH (ref 11.7–15.4)
WBC: 7 10*3/uL (ref 3.4–10.8)

## 2023-09-14 ENCOUNTER — Other Ambulatory Visit: Payer: Self-pay | Admitting: Family

## 2023-09-14 DIAGNOSIS — R921 Mammographic calcification found on diagnostic imaging of breast: Secondary | ICD-10-CM

## 2023-09-18 ENCOUNTER — Other Ambulatory Visit (HOSPITAL_COMMUNITY): Payer: Self-pay

## 2023-09-19 ENCOUNTER — Ambulatory Visit: Payer: Medicaid Other

## 2023-10-03 ENCOUNTER — Other Ambulatory Visit: Payer: Self-pay | Admitting: Family

## 2023-10-03 DIAGNOSIS — R921 Mammographic calcification found on diagnostic imaging of breast: Secondary | ICD-10-CM

## 2023-10-03 DIAGNOSIS — R928 Other abnormal and inconclusive findings on diagnostic imaging of breast: Secondary | ICD-10-CM

## 2023-10-05 ENCOUNTER — Other Ambulatory Visit: Payer: Self-pay | Admitting: Advanced Practice Midwife

## 2023-10-05 DIAGNOSIS — R928 Other abnormal and inconclusive findings on diagnostic imaging of breast: Secondary | ICD-10-CM

## 2023-10-05 DIAGNOSIS — R921 Mammographic calcification found on diagnostic imaging of breast: Secondary | ICD-10-CM

## 2023-10-09 ENCOUNTER — Encounter

## 2023-10-23 ENCOUNTER — Ambulatory Visit: Payer: Self-pay | Admitting: General Surgery

## 2023-11-08 NOTE — Progress Notes (Signed)
 COVID Vaccine Completed:  Date of COVID positive in last 90 days:  PCP - Lavona Pounds, Np Cardiologist - n/a OBGYN- Elana Grayer, MD  Chest x-ray - n/a EKG - 11/12/23 Epic/chart Stress Test - n/a ECHO - n/a Cardiac Cath - n/a Pacemaker/ICD device last checked: n/a Spinal Cord Stimulator: n/a  Bowel Prep - no  Sleep Study - n/a CPAP -   Fasting Blood Sugar - n/a Checks Blood Sugar _____ times a day  Last dose of GLP1 agonist-  N/A GLP1 instructions:  Hold 7 days before surgery    Last dose of SGLT-2 inhibitors-  N/A SGLT-2 instructions:  Hold 3 days before surgery    Blood Thinner Instructions:  Last dose: n/a  Time: Aspirin  Instructions: Last Dose:  Activity level: Can go up a flight of stairs and perform activities of daily living without stopping and without symptoms of chest pain or shortness of breath.  Anesthesia review:   Patient denies shortness of breath, fever, cough and chest pain at PAT appointment  Patient verbalized understanding of instructions that were given to them at the PAT appointment. Patient was also instructed that they will need to review over the PAT instructions again at home before surgery.

## 2023-11-08 NOTE — Patient Instructions (Signed)
 SURGICAL WAITING ROOM VISITATION  Patients having surgery or a procedure may have no more than 2 support people in the waiting area - these visitors may rotate.    Children under the age of 4 must have an adult with them who is not the patient.  Due to an increase in RSV and influenza rates and associated hospitalizations, children ages 78 and under may not visit patients in [facility] hospitals.  Visitors with respiratory illnesses are discouraged from visiting and should remain at home.  If the patient needs to stay at the hospital during part of their recovery, the visitor guidelines for inpatient rooms apply. Pre-op nurse will coordinate an appropriate time for 1 support person to accompany patient in pre-op.  This support person may not rotate.    Please refer to the [facility] website for the visitor guidelines for Inpatients (after your surgery is over and you are in a regular room).    Your procedure is scheduled on: 11/22/23   Report to [facility] Main Entrance    Report to admitting at 5:15 AM   Call this number if you have problems the morning of surgery [phone]   Do not eat food :After Midnight.   After Midnight you may have the following liquids until 4:30 AM DAY OF SURGERY  Water  Non-Citrus Juices (without pulp, NO RED-Apple, White grape, White cranberry) Black Coffee (NO MILK/CREAM OR CREAMERS, sugar ok)  Clear Tea (NO MILK/CREAM OR CREAMERS, sugar ok) regular and decaf                             Plain Jell-O (NO RED)                                           Fruit ices (not with fruit pulp, NO RED)                                     Popsicles (NO RED)                                                               Sports drinks like Gatorade (NO RED)          If you have questions, please contact your surgeon's office.   FOLLOW BOWEL PREP AND ANY ADDITIONAL PRE OP INSTRUCTIONS YOU RECEIVED FROM YOUR SURGEON'S OFFICE!!!     Oral Hygiene is also  important to reduce your risk of infection.                                    Remember - BRUSH YOUR TEETH THE MORNING OF SURGERY WITH YOUR REGULAR TOOTHPASTE  DENTURES WILL BE REMOVED PRIOR TO SURGERY PLEASE DO NOT APPLY "Poly grip" OR ADHESIVES!!!   Stop all vitamins and herbal supplements 7 days before surgery.   Take these medicines the morning of surgery with A SIP OF WATER : None  You may not have any metal on your body including hair pins, jewelry, and body piercing             Do not wear make-up, lotions, powders, perfumes, or deodorant  Do not wear nail polish including gel and S&S, artificial/acrylic nails, or any other type of covering on natural nails including finger and toenails. If you have artificial nails, gel coating, etc. that needs to be removed by a nail salon please have this removed prior to surgery or surgery may need to be canceled/ delayed if the surgeon/ anesthesia feels like they are unable to be safely monitored.   Do not shave  48 hours prior to surgery.    Do not bring valuables to the hospital. [facility] IS NOT             RESPONSIBLE   FOR VALUABLES.   Contacts, glasses, dentures or bridgework may not be worn into surgery.  DO NOT BRING YOUR HOME MEDICATIONS TO THE HOSPITAL. PHARMACY WILL DISPENSE MEDICATIONS LISTED ON YOUR MEDICATION LIST TO YOU DURING YOUR ADMISSION IN THE HOSPITAL!    Patients discharged on the day of surgery will not be allowed to drive home.  Someone NEEDS to stay with you for the first 24 hours after anesthesia.   Special Instructions: Bring a copy of your healthcare power of attorney and living will documents the day of surgery if you haven't scanned them before.              Please read over the following fact sheets you were given: IF YOU HAVE QUESTIONS ABOUT YOUR PRE-OP INSTRUCTIONS PLEASE CALL [name]    If you received a COVID test during your pre-op visit  it is requested that  you wear a mask when out in public, stay away from anyone that may not be feeling well and notify your surgeon if you develop symptoms. If you test positive for Covid or have been in contact with anyone that has tested positive in the last 10 days please notify you surgeon.    [facility] - Preparing for Surgery Before surgery, you can play an important role.  Because skin is not sterile, your skin needs to be as free of germs as possible.  You can reduce the number of germs on your skin by washing with CHG (chlorahexidine gluconate) soap before surgery.  CHG is an antiseptic cleaner which kills germs and bonds with the skin to continue killing germs even after washing. Please DO NOT use if you have an allergy to CHG or antibacterial soaps.  If your skin becomes reddened/irritated stop using the CHG and inform your nurse when you arrive at Short Stay. Do not shave (including legs and underarms) for at least 48 hours prior to the first CHG shower.  You may shave your face/neck.  Please follow these instructions carefully:  1.  Shower with CHG Soap the night before surgery and the  morning of surgery.  2.  If you choose to wash your hair, wash your hair first as usual with your normal  shampoo.  3.  After you shampoo, rinse your hair and body thoroughly to remove the shampoo.                             4.  Use CHG as you would any other liquid soap.  You can apply chg directly to the skin and wash.  Gently with a  scrungie or clean washcloth.  5.  Apply the CHG Soap to your body ONLY FROM THE NECK DOWN.   Do   not use on face/ open                           Wound or open sores. Avoid contact with eyes, ears mouth and   genitals (private parts).                       Wash face,  Genitals (private parts) with your normal soap.             6.  Wash thoroughly, paying special attention to the area where your    surgery  will be performed.  7.  Thoroughly rinse your body with warm water  from the neck  down.  8.  DO NOT shower/wash with your normal soap after using and rinsing off the CHG Soap.                9.  Pat yourself dry with a clean towel.            10.  Wear clean pajamas.            11.  Place clean sheets on your bed the night of your first shower and do not  sleep with pets. Day of Surgery : Do not apply any lotions/deodorants the morning of surgery.  Please wear clean clothes to the hospital/surgery center.  FAILURE TO FOLLOW THESE INSTRUCTIONS MAY RESULT IN THE CANCELLATION OF YOUR SURGERY  PATIENT SIGNATURE_________________________________  NURSE SIGNATURE__________________________________  ________________________________________________________________________

## 2023-11-09 ENCOUNTER — Other Ambulatory Visit: Payer: Self-pay

## 2023-11-12 ENCOUNTER — Other Ambulatory Visit: Payer: Self-pay

## 2023-11-12 ENCOUNTER — Encounter (HOSPITAL_COMMUNITY): Payer: Self-pay

## 2023-11-12 ENCOUNTER — Encounter (HOSPITAL_COMMUNITY)
Admission: RE | Admit: 2023-11-12 | Discharge: 2023-11-12 | Disposition: A | Discharge status: Home / Self Care | Source: Ambulatory Visit | Attending: General Surgery | Admitting: General Surgery

## 2023-11-12 VITALS — BP 108/67 | HR 60 | Temp 97.8°F | Resp 16 | Ht 59.0 in | Wt 148.2 lb

## 2023-11-12 DIAGNOSIS — Z01818 Encounter for other preprocedural examination: Secondary | ICD-10-CM | POA: Insufficient documentation

## 2023-11-12 DIAGNOSIS — I272 Pulmonary hypertension, unspecified: Secondary | ICD-10-CM | POA: Diagnosis present

## 2023-11-12 LAB — CBC
HCT: 41.5 % (ref 36.0–46.0)
Hemoglobin: 12.7 g/dL (ref 12.0–15.0)
MCH: 25.3 pg — ABNORMAL LOW (ref 26.0–34.0)
MCHC: 30.6 g/dL (ref 30.0–36.0)
MCV: 82.8 fL (ref 80.0–100.0)
Platelets: 289 10*3/uL (ref 150–400)
RBC: 5.01 MIL/uL (ref 3.87–5.11)
RDW: 13.2 % (ref 11.5–15.5)
WBC: 8.3 10*3/uL (ref 4.0–10.5)
nRBC: 0 % (ref 0.0–0.2)

## 2023-11-12 LAB — BASIC METABOLIC PANEL WITH GFR
Anion gap: 7 (ref 5–15)
BUN: 14 mg/dL (ref 6–20)
CO2: 24 mmol/L (ref 22–32)
Calcium: 9.1 mg/dL (ref 8.9–10.3)
Chloride: 106 mmol/L (ref 98–111)
Creatinine, Ser: 0.49 mg/dL (ref 0.44–1.00)
GFR, Estimated: >60 mL/min (ref >60)
Glucose, Bld: 93 mg/dL (ref 70–99)
Potassium: 3.7 mmol/L (ref 3.5–5.1)
Sodium: 137 mmol/L (ref 135–145)

## 2023-11-18 IMAGING — US US ABDOMEN COMPLETE
1 series · 14 of 25 positions shown · non-contrast
Comparison: None Available.

CLINICAL DATA: Abdominal pain

EXAM:
ABDOMEN ULTRASOUND COMPLETE

[Series 1: us abdomen complete · 14 of 89 slices shown]
[im 1/89]
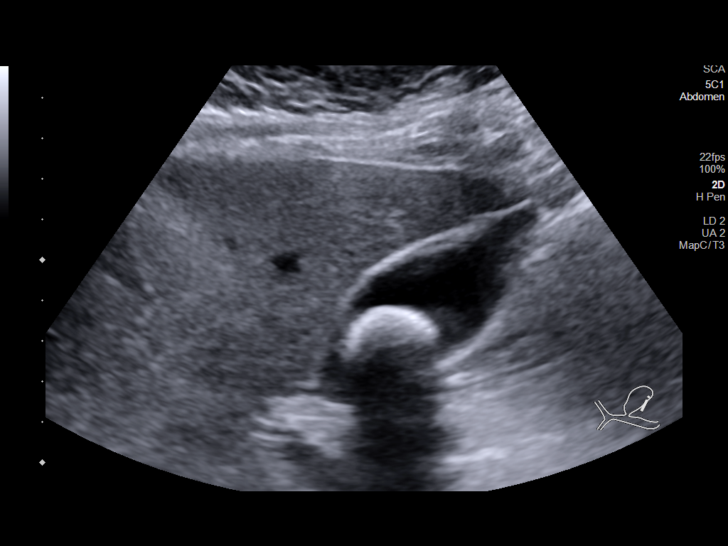
[im 8/89]
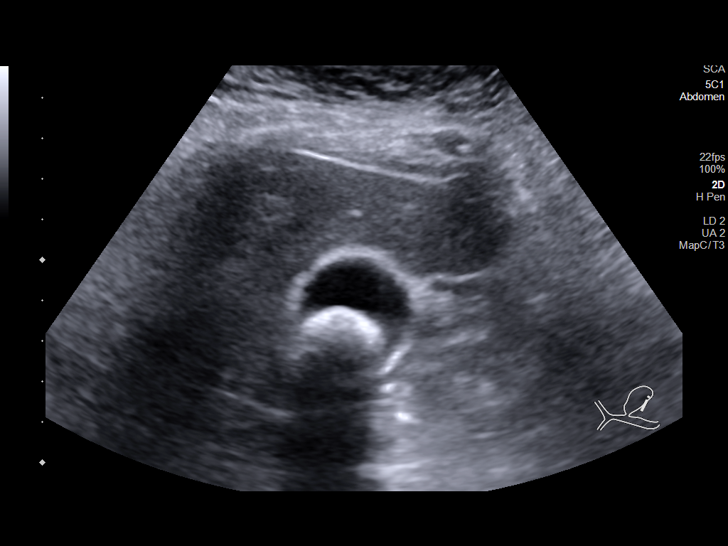
[im 15/89]
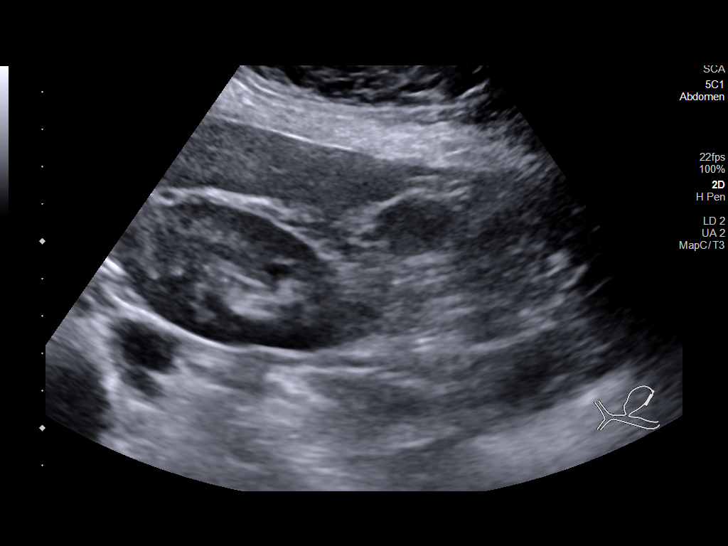
[im 23/89]
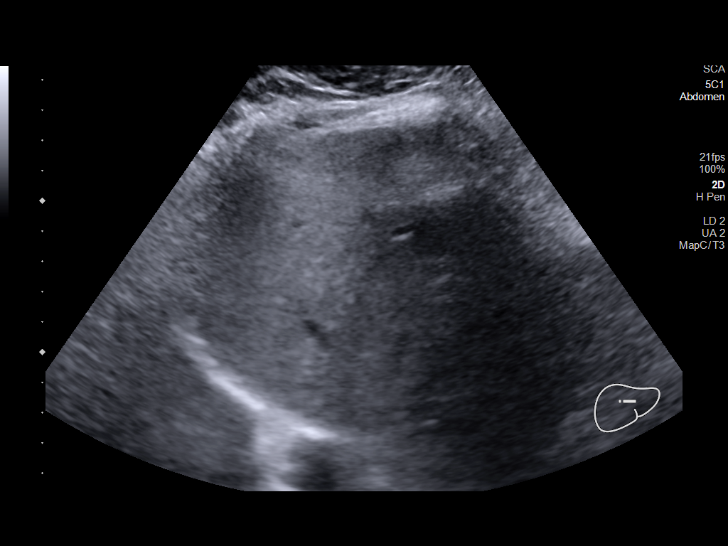
[im 30/89]
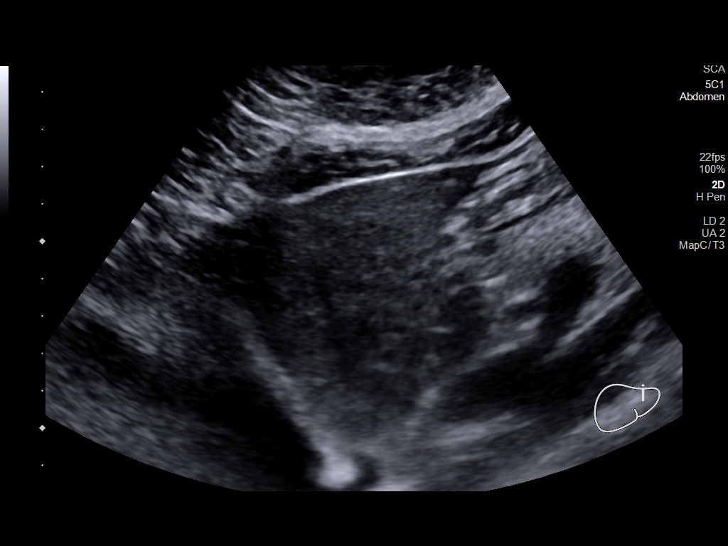
[im 34/89]
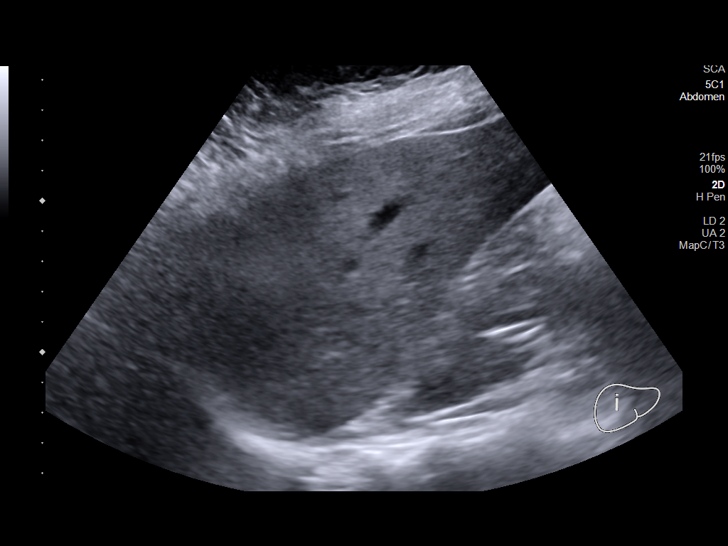
[im 41/89]
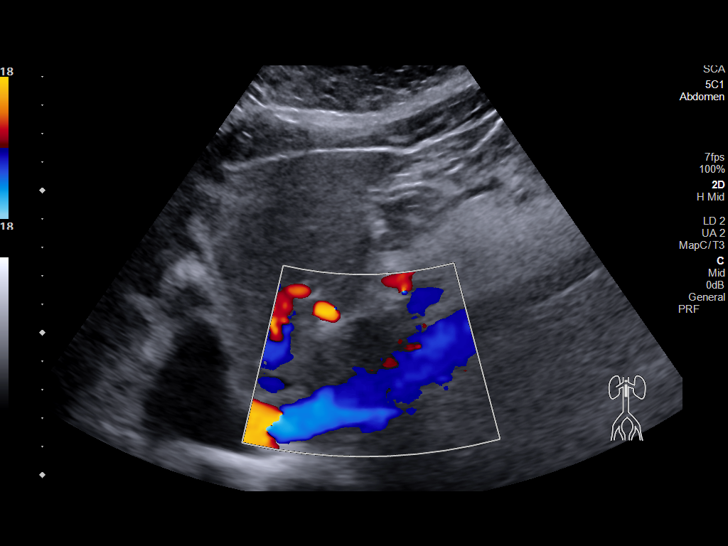
[im 48/89]
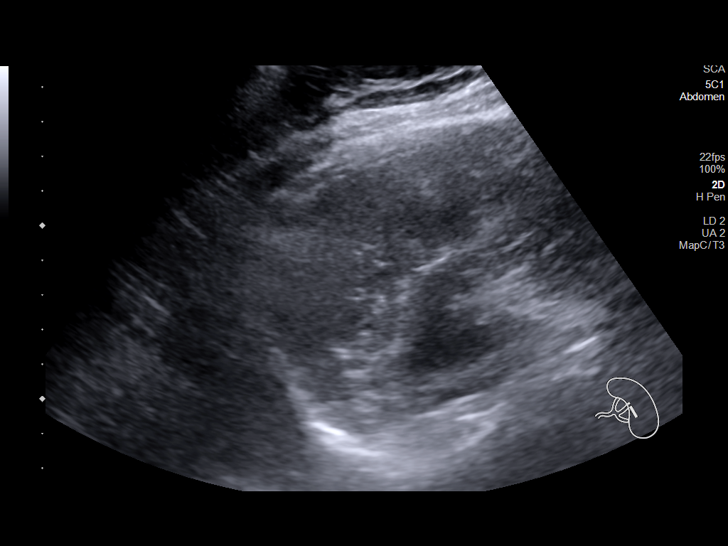
[im 56/89]
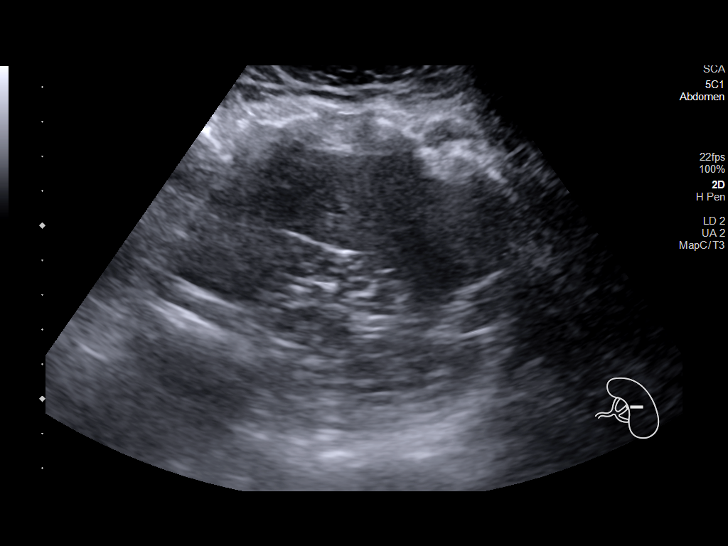
[im 59/89]
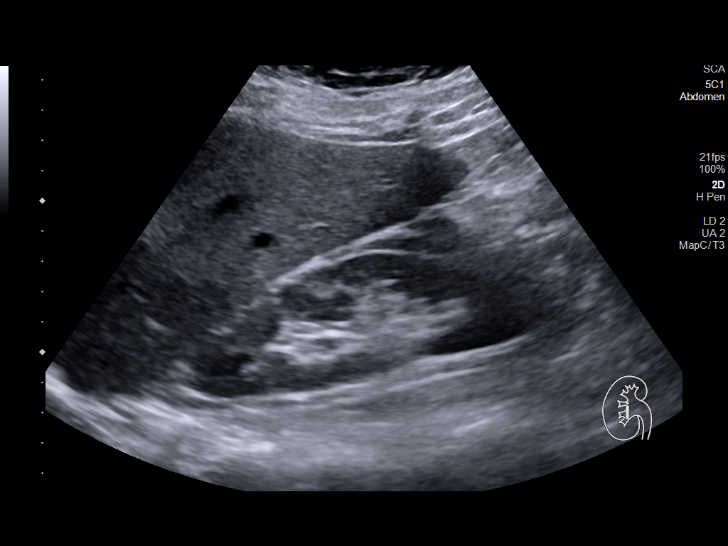
[im 67/89]
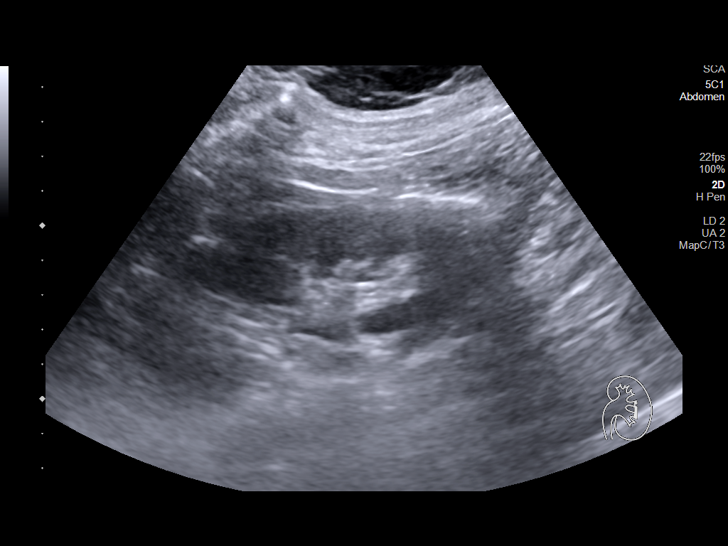
[im 74/89]
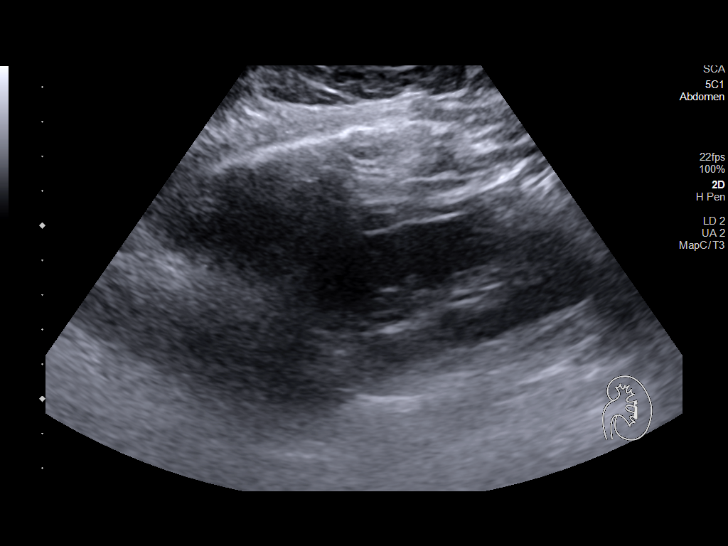
[im 81/89]
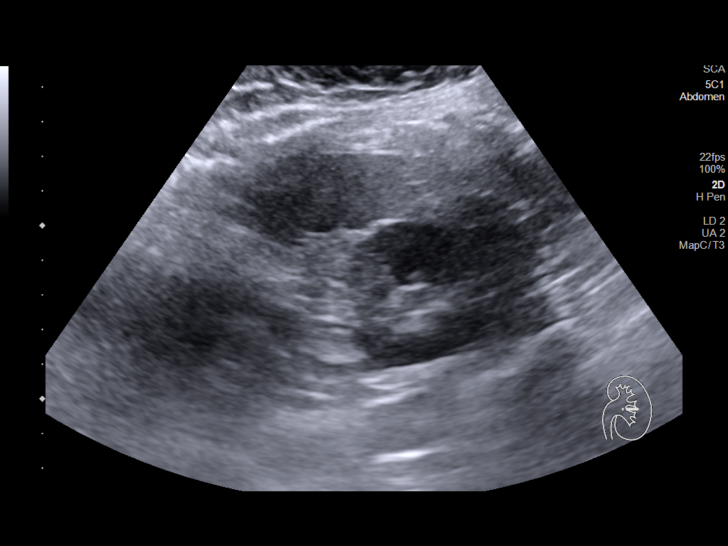
[im 89/89]
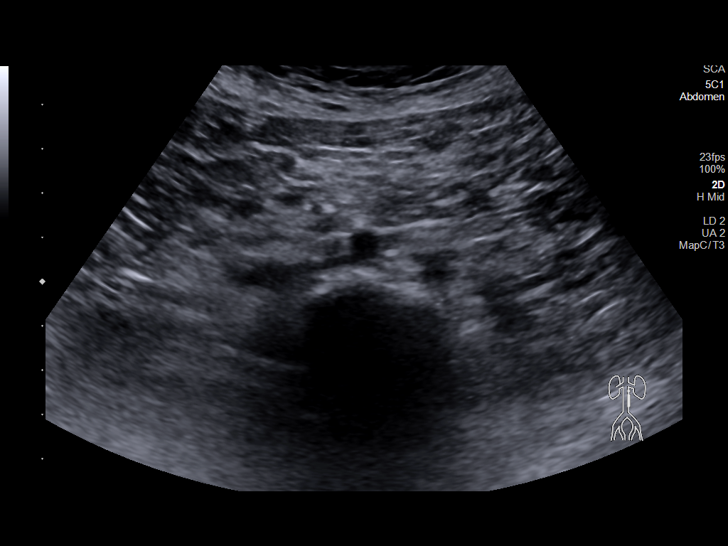

[14 of 25 positions shown; findings below may reference images not displayed]

FINDINGS: Gallbladder: 2.8 cm shadowing calculus. No significant wall
thickening or surrounding fluid. Negative sonographic Murphy's sign.

Common bile duct: Diameter: 4 mm

Liver: No focal lesion identified. Within normal limits in
parenchymal echogenicity. Portal vein is patent on color Doppler
imaging with normal direction of blood flow towards the liver.

IVC: No abnormality visualized.

Pancreas: Visualized portion unremarkable.

Spleen: Size and appearance within normal limits.

Right Kidney: Length: 11.3 cm. Echogenicity within normal limits. No
mass or hydronephrosis visualized.

Left Kidney: Length: 10.2 cm. Echogenicity within normal limits. 9
mm hypoechoic cyst in the midpole. No hydronephrosis visualized.

Abdominal aorta: No aneurysm visualized.

Other findings: None.
IMPRESSION: 1. Gallstone.
2. Small left renal cyst.

## 2023-11-22 ENCOUNTER — Ambulatory Visit (HOSPITAL_BASED_OUTPATIENT_CLINIC_OR_DEPARTMENT_OTHER): Payer: Self-pay | Admitting: Certified Registered"

## 2023-11-22 ENCOUNTER — Ambulatory Visit (HOSPITAL_COMMUNITY): Payer: Self-pay | Admitting: Certified Registered"

## 2023-11-22 ENCOUNTER — Encounter (HOSPITAL_COMMUNITY): Payer: Self-pay | Admitting: General Surgery

## 2023-11-22 ENCOUNTER — Ambulatory Visit (HOSPITAL_COMMUNITY)
Admission: RE | Admit: 2023-11-22 | Discharge: 2023-11-22 | Disposition: A | Discharge status: Home / Self Care | Attending: General Surgery | Admitting: General Surgery

## 2023-11-22 ENCOUNTER — Other Ambulatory Visit: Payer: Self-pay

## 2023-11-22 ENCOUNTER — Encounter (HOSPITAL_COMMUNITY)
Admission: RE | Disposition: A | Discharge status: Home / Self Care | Payer: Self-pay | Source: Home / Self Care | Attending: General Surgery

## 2023-11-22 DIAGNOSIS — K420 Umbilical hernia with obstruction, without gangrene: Secondary | ICD-10-CM

## 2023-11-22 DIAGNOSIS — K429 Umbilical hernia without obstruction or gangrene: Secondary | ICD-10-CM | POA: Diagnosis present

## 2023-11-22 LAB — POCT PREGNANCY, URINE: Preg Test, Ur: NEGATIVE

## 2023-11-22 SURGERY — REPAIR, HERNIA, UMBILICAL, ADULT
Anesthesia: General

## 2023-11-22 MED ORDER — ONDANSETRON HCL 4 MG/2ML IJ SOLN
INTRAMUSCULAR | Status: DC | PRN
  Administered 2023-11-22: 4 mg via INTRAVENOUS

## 2023-11-22 MED ORDER — PROPOFOL 10 MG/ML IV BOLUS
INTRAVENOUS | Status: DC | PRN
  Administered 2023-11-22: 200 mg via INTRAVENOUS

## 2023-11-22 MED ORDER — OXYCODONE HCL 5 MG PO TABS
ORAL_TABLET | ORAL | Status: AC
Start: 2023-11-22 — End: ?
  Filled 2023-11-22: qty 1

## 2023-11-22 MED ORDER — FENTANYL CITRATE PF 50 MCG/ML IJ SOSY: 25.0000 ug | PREFILLED_SYRINGE | INTRAMUSCULAR | Status: DC | PRN

## 2023-11-22 MED ORDER — ORAL CARE MOUTH RINSE: 15.0000 mL | Freq: Once | OROMUCOSAL | Status: AC

## 2023-11-22 MED ORDER — FENTANYL CITRATE (PF) 100 MCG/2ML IJ SOLN
INTRAMUSCULAR | Status: AC
  Filled 2023-11-22: qty 2

## 2023-11-22 MED ORDER — PROPOFOL 10 MG/ML IV BOLUS
INTRAVENOUS | Status: AC
  Filled 2023-11-22: qty 20

## 2023-11-22 MED ORDER — ACETAMINOPHEN 500 MG PO TABS
1000.0000 mg | ORAL_TABLET | ORAL | Status: AC
  Administered 2023-11-22: 1000 mg via ORAL
  Filled 2023-11-22: qty 2

## 2023-11-22 MED ORDER — MIDAZOLAM HCL 2 MG/2ML IJ SOLN
INTRAMUSCULAR | Status: AC
  Filled 2023-11-22: qty 2

## 2023-11-22 MED ORDER — LIDOCAINE HCL (PF) 2 % IJ SOLN
INTRAMUSCULAR | Status: AC
  Filled 2023-11-22: qty 5

## 2023-11-22 MED ORDER — CHLORHEXIDINE GLUCONATE 0.12 % MT SOLN
15.0000 mL | Freq: Once | OROMUCOSAL | Status: AC
  Administered 2023-11-22: 15 mL via OROMUCOSAL

## 2023-11-22 MED ORDER — DEXAMETHASONE SODIUM PHOSPHATE 10 MG/ML IJ SOLN
INTRAMUSCULAR | Status: AC
Start: 2023-11-22 — End: ?
  Filled 2023-11-22: qty 1

## 2023-11-22 MED ORDER — ROCURONIUM BROMIDE 10 MG/ML (PF) SYRINGE
PREFILLED_SYRINGE | INTRAVENOUS | Status: AC
Start: 2023-11-22 — End: ?
  Filled 2023-11-22: qty 10

## 2023-11-22 MED ORDER — 0.9 % SODIUM CHLORIDE (POUR BTL) OPTIME
TOPICAL | Status: DC | PRN
  Administered 2023-11-22: 1000 mL

## 2023-11-22 MED ORDER — OXYCODONE HCL 5 MG PO TABS: 5.0000 mg | ORAL_TABLET | Freq: Four times a day (QID) | ORAL | 0 refills | Status: AC | PRN

## 2023-11-22 MED ORDER — CEFAZOLIN SODIUM-DEXTROSE 2-4 GM/100ML-% IV SOLN
2.0000 g | INTRAVENOUS | Status: AC
Start: 2023-11-22 — End: 2023-11-22
  Administered 2023-11-22: 2 g via INTRAVENOUS
  Filled 2023-11-22: qty 100

## 2023-11-22 MED ORDER — AMISULPRIDE (ANTIEMETIC) 5 MG/2ML IV SOLN
10.0000 mg | Freq: Once | INTRAVENOUS | Status: AC | PRN
  Administered 2023-11-22: 10 mg via INTRAVENOUS

## 2023-11-22 MED ORDER — IBUPROFEN 800 MG PO TABS
800.0000 mg | ORAL_TABLET | Freq: Three times a day (TID) | ORAL | 0 refills | Status: AC | PRN
Start: 2023-11-22 — End: ?

## 2023-11-22 MED ORDER — SUGAMMADEX SODIUM 200 MG/2ML IV SOLN
INTRAVENOUS | Status: DC | PRN
  Administered 2023-11-22: 140 mg via INTRAVENOUS

## 2023-11-22 MED ORDER — FENTANYL CITRATE (PF) 100 MCG/2ML IJ SOLN
INTRAMUSCULAR | Status: DC | PRN
  Administered 2023-11-22: 100 ug via INTRAVENOUS
  Administered 2023-11-22: 50 ug via INTRAVENOUS

## 2023-11-22 MED ORDER — OXYCODONE HCL 5 MG/5ML PO SOLN: 5.0000 mg | Freq: Once | ORAL | Status: AC | PRN

## 2023-11-22 MED ORDER — ROCURONIUM BROMIDE 10 MG/ML (PF) SYRINGE
PREFILLED_SYRINGE | INTRAVENOUS | Status: DC | PRN
  Administered 2023-11-22: 10 mg via INTRAVENOUS
  Administered 2023-11-22: 40 mg via INTRAVENOUS

## 2023-11-22 MED ORDER — LACTATED RINGERS IV SOLN: INTRAVENOUS | Status: DC

## 2023-11-22 MED ORDER — AMISULPRIDE (ANTIEMETIC) 5 MG/2ML IV SOLN
INTRAVENOUS | Status: AC
  Filled 2023-11-22: qty 4

## 2023-11-22 MED ORDER — ONDANSETRON HCL 4 MG/2ML IJ SOLN
INTRAMUSCULAR | Status: AC
  Filled 2023-11-22: qty 2

## 2023-11-22 MED ORDER — DEXAMETHASONE SODIUM PHOSPHATE 10 MG/ML IJ SOLN
INTRAMUSCULAR | Status: DC | PRN
  Administered 2023-11-22: 8 mg via INTRAVENOUS

## 2023-11-22 MED ORDER — CELECOXIB 200 MG PO CAPS
400.0000 mg | ORAL_CAPSULE | ORAL | Status: AC
  Administered 2023-11-22: 400 mg via ORAL
  Filled 2023-11-22: qty 2

## 2023-11-22 MED ORDER — BUPIVACAINE-EPINEPHRINE 0.25% -1:200000 IJ SOLN
INTRAMUSCULAR | Status: DC | PRN
  Administered 2023-11-22: 30 mL

## 2023-11-22 MED ORDER — CHLORHEXIDINE GLUCONATE CLOTH 2 % EX PADS: 6.0000 | MEDICATED_PAD | Freq: Once | CUTANEOUS | Status: DC

## 2023-11-22 MED ORDER — MIDAZOLAM HCL 2 MG/2ML IJ SOLN
INTRAMUSCULAR | Status: DC | PRN
  Administered 2023-11-22: 2 mg via INTRAVENOUS

## 2023-11-22 MED ORDER — LIDOCAINE HCL (CARDIAC) PF 100 MG/5ML IV SOSY
PREFILLED_SYRINGE | INTRAVENOUS | Status: DC | PRN
  Administered 2023-11-22: 60 mg via INTRAVENOUS

## 2023-11-22 MED ORDER — BUPIVACAINE-EPINEPHRINE (PF) 0.25% -1:200000 IJ SOLN
INTRAMUSCULAR | Status: AC
  Filled 2023-11-22: qty 30

## 2023-11-22 MED ORDER — OXYCODONE HCL 5 MG PO TABS
5.0000 mg | ORAL_TABLET | Freq: Once | ORAL | Status: AC | PRN
  Administered 2023-11-22: 5 mg via ORAL

## 2023-11-22 SURGICAL SUPPLY — 26 items
BAG COUNTER SPONGE SURGICOUNT (BAG) IMPLANT
BINDER ABDOMINAL 12 ML 46-62 (SOFTGOODS) IMPLANT
BLADE SURG SZ10 CARB STEEL (BLADE) ×1 IMPLANT
CHLORAPREP W/TINT 26 (MISCELLANEOUS) ×1 IMPLANT
COVER SURGICAL LIGHT HANDLE (MISCELLANEOUS) ×1 IMPLANT
DERMABOND ADVANCED .7 DNX12 (GAUZE/BANDAGES/DRESSINGS) ×1 IMPLANT
DRAPE LAPAROSCOPIC ABDOMINAL (DRAPES) ×1 IMPLANT
ELECT REM PT RETURN 15FT ADLT (MISCELLANEOUS) ×1 IMPLANT
GLOVE BIOGEL PI IND STRL 7.0 (GLOVE) ×1 IMPLANT
GLOVE SURG SS PI 7.0 STRL IVOR (GLOVE) ×1 IMPLANT
GOWN STRL REUS W/ TWL LRG LVL3 (GOWN DISPOSABLE) ×1 IMPLANT
KIT BASIN OR (CUSTOM PROCEDURE TRAY) ×2 IMPLANT
KIT TURNOVER KIT A (KITS) IMPLANT
MARKER SKIN DUAL TIP RULER LAB (MISCELLANEOUS) ×1 IMPLANT
MESH BARD SOFT 6X6IN (Mesh General) IMPLANT
NDL HYPO 22X1.5 SAFETY MO (MISCELLANEOUS) ×1 IMPLANT
NEEDLE HYPO 22X1.5 SAFETY MO (MISCELLANEOUS) ×1 IMPLANT
PACK GENERAL/GYN (CUSTOM PROCEDURE TRAY) ×1 IMPLANT
SPIKE FLUID TRANSFER (MISCELLANEOUS) ×1 IMPLANT
SUT MNCRL AB 4-0 PS2 18 (SUTURE) ×1 IMPLANT
SUT NOVA NAB GS-21 0 18 T12 DT (SUTURE) IMPLANT
SUT PDS AB 0 CT1 36 (SUTURE) IMPLANT
SUT VIC AB 3-0 SH 27X BRD (SUTURE) ×1 IMPLANT
SYR CONTROL 10ML LL (SYRINGE) ×1 IMPLANT
TOWEL OR 17X26 10 PK STRL BLUE (TOWEL DISPOSABLE) ×1 IMPLANT
YANKAUER SUCT BULB TIP 10FT TU (MISCELLANEOUS) ×1 IMPLANT

## 2023-11-22 NOTE — Discharge Instructions (Signed)

## 2023-11-22 NOTE — Anesthesia Preprocedure Evaluation (Addendum)
 Anesthesia Evaluation  Patient identified by MRN, date of birth, ID band Patient awake    Reviewed: Allergy & Precautions, NPO status , Patient's Chart, lab work & pertinent test results  Airway Mallampati: II  TM Distance: >3 FB Neck ROM: Full    Dental no notable dental hx.    Pulmonary neg pulmonary ROS   Pulmonary exam normal        Cardiovascular pulmonary hypertensionNormal cardiovascular exam     Neuro/Psych negative neurological ROS  negative psych ROS   GI/Hepatic negative GI ROS, Neg liver ROS,,,  Endo/Other  negative endocrine ROS    Renal/GU negative Renal ROS     Musculoskeletal negative musculoskeletal ROS (+)    Abdominal   Peds  Hematology negative hematology ROS (+)   Anesthesia Other Findings UMBILICAL HERNIA   Reproductive/Obstetrics Hcg negative  S/p TL                              Anesthesia Physical Anesthesia Plan  ASA: 1  Anesthesia Plan: General   Post-op Pain Management:    Induction: Intravenous  PONV Risk Score and Plan: 3 and Ondansetron , Dexamethasone , Midazolam and Treatment may vary due to age or medical condition  Airway Management Planned: Oral ETT  Additional Equipment:   Intra-op Plan:   Post-operative Plan: Extubation in OR  Informed Consent: I have reviewed the patients History and Physical, chart, labs and discussed the procedure including the risks, benefits and alternatives for the proposed anesthesia with the patient or authorized representative who has indicated his/her understanding and acceptance.     Dental advisory given  Plan Discussed with: CRNA  Anesthesia Plan Comments:         Anesthesia Quick Evaluation

## 2023-11-22 NOTE — Anesthesia Procedure Notes (Signed)
 Procedure Name: Intubation Date/Time: 11/22/2023 7:42 AM  Performed by: Alwyn Juba, CRNAPre-anesthesia Checklist: Patient identified, Emergency Drugs available, Suction available, Patient being monitored and Timeout performed Patient Re-evaluated:Patient Re-evaluated prior to induction Oxygen Delivery Method: Circle system utilized Preoxygenation: Pre-oxygenation with 100% oxygen Induction Type: IV induction Ventilation: Mask ventilation without difficulty Laryngoscope Size: Mac and 4 Grade View: Grade I Tube type: Oral Tube size: 7.0 mm Number of attempts: 1 Airway Equipment and Method: Stylet Placement Confirmation: ETT inserted through vocal cords under direct vision, positive ETCO2 and breath sounds checked- equal and bilateral Secured at: 20 cm Tube secured with: Tape Dental Injury: Teeth and Oropharynx as per pre-operative assessment

## 2023-11-22 NOTE — Transfer of Care (Signed)
 Immediate Anesthesia Transfer of Care Note  Patient: Kelly Oneal  Procedure(s) Performed: REPAIR, HERNIA, UMBILICAL, ADULT WITH MESH  Patient Location: PACU  Anesthesia Type:General  Level of Consciousness: awake, drowsy, and patient cooperative  Airway & Oxygen Therapy: Patient Spontanous Breathing and Patient connected to face mask oxygen  Post-op Assessment: Report given to RN and Post -op Vital signs reviewed and stable  Post vital signs: Reviewed and stable  Last Vitals:  Vitals Value Taken Time  BP 116/79 11/22/23 0856  Temp    Pulse 91 11/22/23 0858  Resp 34 11/22/23 0858  SpO2 100 % 11/22/23 0858  Vitals shown include unfiled device data.  Last Pain:  Vitals:   11/22/23 0544  TempSrc:   PainSc: 0-No pain         Complications: No notable events documented.

## 2023-11-22 NOTE — H&P (Signed)
 Chief Complaint  Patient presents with  New Consultation   Subjective   Kelly Oneal is a 43 y.o. female new patient in today for: History of Present Illness The patient, a mother of two, presents with a 14-year history of a hernia that has been causing her discomfort. The hernia developed after the birth of her first child. She had another child 8 weeks ago. She experiences pain when lifting objects or when her baby kicks her during diaper changes. The patient also reports occasional nausea. She has not had any previous abdominal surgeries except for a tubal ligation. She is currently breastfeeding.    No data to display      No outpatient medications prior to visit.   No facility-administered medications prior to visit.   Review of Systems  Constitutional: Negative.  HENT: Negative.  Eyes: Negative.  Respiratory: Negative.  Cardiovascular: Negative.  Gastrointestinal: Positive for abdominal pain and nausea.  Genitourinary: Negative.  Musculoskeletal: Negative.  Skin: Negative.  Neurological: Negative.  Endo/Heme/Allergies: Negative.  Psychiatric/Behavioral: Negative.    Objective   Vitals:  08/16/23 0922 08/16/23 0923  BP: 116/72  Pulse: 69  Temp: 36.6 C (97.9 F)  SpO2: 96%  Weight: 65.9 kg (145 lb 3.2 oz)  Height: 149.9 cm (4\' 11" )  PainSc: 0-No pain  PainLoc: Abdomen   Body mass index is 29.33 kg/m. Physical Exam Constitutional:  Appearance: Normal appearance.  HENT:  Head: Normocephalic and atraumatic.  Pulmonary:  Effort: Pulmonary effort is normal.  Abdominal:  Comments: Moderate hernia sac, reducible in supine position  Musculoskeletal:  General: Normal range of motion.  Cervical back: Normal range of motion.  Neurological:  General: No focal deficit present.  Mental Status: She is alert and oriented to person, place, and time. Mental status is at baseline.  Psychiatric:  Mood and Affect: Mood normal.  Behavior: Behavior  normal.  Thought Content: Thought content normal.   I reviewed notes by Arlester Bence  Assessment/Plan:   Assessment & Plan Umbilical Hernia Chronic hernia for 14 years, exacerbated by recent pregnancy. Pain and discomfort when lifting or applying pressure. No significant nausea, vomiting, or constipation. No history of abdominal surgery except tubal ligation. The patient has a symptomatic reducible hernia. We discussed the etiology of his hernia, the risk of it enlarging, incarceration, obstruction, strangulation, and that it is unlikely to get smaller or better on its own. We discussed operative options of laparoscopic vs open repair with mesh including the risks of recurrence, injury to intestines or abdominal organs, or chronic pain associated with mesh. We decided to proceed with open umbilical hernia repair with mesh as outpatient.  -Surgery scheduled as soon as possible. -Postoperative pain management with oxycodone  and ibuprofen , both safe for breastfeeding. -Follow-up with obstetrician to confirm safety of breastfeeding post-anesthesia.  General Health Maintenance -Continue breastfeeding as tolerated.  Diagnoses and all orders for this visit:  Umbilical hernia without obstruction or gangrene

## 2023-11-22 NOTE — Anesthesia Postprocedure Evaluation (Signed)
 Anesthesia Post Note  Patient: Kelly Oneal  Procedure(s) Performed: REPAIR, HERNIA, UMBILICAL, ADULT WITH MESH     Patient location during evaluation: PACU Anesthesia Type: General Level of consciousness: awake Pain management: pain level controlled Vital Signs Assessment: post-procedure vital signs reviewed and stable Respiratory status: spontaneous breathing, nonlabored ventilation and respiratory function stable Cardiovascular status: blood pressure returned to baseline and stable Postop Assessment: no apparent nausea or vomiting Anesthetic complications: no   No notable events documented.  Last Vitals:  Vitals:   11/22/23 1000 11/22/23 1018  BP: 114/79 117/82  Pulse: 77 77  Resp: (!) 22 20  Temp:  (!) 36.4 C  SpO2: 97% 96%    Last Pain:  Vitals:   11/22/23 1018  TempSrc:   PainSc: 0-No pain                 Butch Otterson P Daxx Tiggs

## 2023-11-22 NOTE — Op Note (Addendum)
 PATIENT:  Kelly Oneal  43 y.o. female  PRE-OPERATIVE DIAGNOSIS:  UMBILICAL HERNIA 4 CM  POST-OPERATIVE DIAGNOSIS:  UMBILICAL HERNIA 4 CM  PROCEDURE:  Procedure(s): REPAIR, HERNIA, incarcerated 4 cm UMBILICAL, ADULT WITH MESH   SURGEON:  Surgeon(s): Britt Theard, Alphonso Aschoff, MD  ASSISTANT: none   ANESTHESIA:   local and general  Indications for procedure: Citlally Ogando is a 43 y.o. year old female with symptoms of abdominal pain and periumbilical distension.  Description of procedure: The patient was brought into the operative suite. Anesthesia was administered with General endotracheal anesthesia. WHO checklist was applied. The patient was then placed in supine. The area was prepped and draped in the usual sterile fashion.  Next the infraumbilical skin was anesthetized with Marcaine . A semilunar infraumbilical incision was made. Cautery and blunt dissection was used to dissect down to the fascia. The hernia sac was dissected free from surrounding tissues in 360 degrees. The umbilical skin was dissected free of the hernia sac with cautery. The hernia sac contained omentum and was opened and contents reduced into the peritoneal space. The sac was closed with 3-0 vicryl in running fashion. The hernia defect was 4 cm in diameter. The hernia sac was removed. Due to the size of the hernia, a 12 x 12 cm Bard mesh was inserted into the preperitoneal space. Care was taken to lay the mesh flat. The mesh was sutured in placed with 1 0 novafil in each quadrant of the periumbilical area. The fascial defect was then primarily closed with interrupted 0 PDS sutures. The umbilical skin was sutured to the fascia with a 3-0 vicryl. The deep dermal space was closed with a 3-0 vicryl. Marcaine  was injected into the muscle layer and around the fascia. The skin was closed with a 4-0 monocryl subcuticular suture. Dermabond was put in place for dressing. The patient awoke from anesthesia and  was brought to pacu in stable condition. All counts were correct.  Findings: 4 cm umbilical hernia containing omentum  Specimen: none  Blood loss: 20 ml  Local anesthesia: 30 ml Marcaine   Complications: none  PLAN OF CARE: Discharge to home after PACU  PATIENT DISPOSITION:  PACU - hemodynamically stable.  Harman Lightning, M.D. General, Bariatric, & Minimally Invasive Surgery [facility], [place]  11/22/2023 8:45 AM

## 2023-11-23 ENCOUNTER — Encounter (HOSPITAL_COMMUNITY): Payer: Self-pay | Admitting: General Surgery
